# Patient Record
Sex: Female | Born: 1985 | Race: White | Hispanic: No | Marital: Single | Smoking: Never smoker
Health system: Southern US, Community
[De-identification: ages and names within clinical notes are randomized; demographics above are authoritative.]

## PROBLEM LIST (undated history)

## (undated) DIAGNOSIS — R87629 Unspecified abnormal cytological findings in specimens from vagina: Secondary | ICD-10-CM

## (undated) DIAGNOSIS — Z87448 Personal history of other diseases of urinary system: Secondary | ICD-10-CM

## (undated) HISTORY — PX: AUGMENTATION MAMMAPLASTY: SUR837

## (undated) HISTORY — DX: Personal history of other diseases of urinary system: Z87.448

## (undated) HISTORY — PX: TONSILLECTOMY: SUR1361

## (undated) HISTORY — DX: Unspecified abnormal cytological findings in specimens from vagina: R87.629

---
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
## 2003-01-02 ENCOUNTER — Other Ambulatory Visit: Admission: RE | Admit: 2003-01-02 | Discharge: 2003-01-02 | Payer: Self-pay | Admitting: Family Medicine

## 2004-10-26 ENCOUNTER — Ambulatory Visit: Payer: Self-pay | Admitting: Family Medicine

## 2004-10-26 ENCOUNTER — Other Ambulatory Visit: Admission: RE | Admit: 2004-10-26 | Discharge: 2004-10-26 | Payer: Self-pay | Admitting: Family Medicine

## 2004-11-21 ENCOUNTER — Ambulatory Visit (HOSPITAL_BASED_OUTPATIENT_CLINIC_OR_DEPARTMENT_OTHER): Admission: RE | Admit: 2004-11-21 | Discharge: 2004-11-21 | Payer: Self-pay | Admitting: Otolaryngology

## 2004-11-21 ENCOUNTER — Ambulatory Visit (HOSPITAL_COMMUNITY): Admission: RE | Admit: 2004-11-21 | Discharge: 2004-11-21 | Payer: Self-pay | Admitting: Otolaryngology

## 2004-11-21 ENCOUNTER — Encounter (INDEPENDENT_AMBULATORY_CARE_PROVIDER_SITE_OTHER): Payer: Self-pay | Admitting: *Deleted

## 2005-04-15 ENCOUNTER — Ambulatory Visit: Payer: Self-pay | Admitting: Family Medicine

## 2006-03-07 ENCOUNTER — Ambulatory Visit: Payer: Self-pay | Admitting: Family Medicine

## 2006-03-07 ENCOUNTER — Encounter: Payer: Self-pay | Admitting: Family Medicine

## 2006-03-07 ENCOUNTER — Other Ambulatory Visit: Admission: RE | Admit: 2006-03-07 | Discharge: 2006-03-07 | Payer: Self-pay | Admitting: Family Medicine

## 2006-03-07 LAB — CONVERTED CEMR LAB: Pap Smear: NORMAL

## 2006-05-23 ENCOUNTER — Ambulatory Visit: Payer: Self-pay | Admitting: Family Medicine

## 2006-10-12 ENCOUNTER — Ambulatory Visit: Payer: Self-pay | Admitting: Family Medicine

## 2007-05-14 ENCOUNTER — Telehealth (INDEPENDENT_AMBULATORY_CARE_PROVIDER_SITE_OTHER): Payer: Self-pay | Admitting: *Deleted

## 2007-06-28 ENCOUNTER — Encounter: Payer: Self-pay | Admitting: Family Medicine

## 2007-06-28 DIAGNOSIS — J309 Allergic rhinitis, unspecified: Secondary | ICD-10-CM | POA: Insufficient documentation

## 2007-06-28 DIAGNOSIS — F329 Major depressive disorder, single episode, unspecified: Secondary | ICD-10-CM | POA: Insufficient documentation

## 2007-07-18 ENCOUNTER — Ambulatory Visit: Payer: Self-pay | Admitting: Family Medicine

## 2007-07-18 ENCOUNTER — Other Ambulatory Visit: Admission: RE | Admit: 2007-07-18 | Discharge: 2007-07-18 | Payer: Self-pay | Admitting: Family Medicine

## 2007-07-18 ENCOUNTER — Encounter: Payer: Self-pay | Admitting: Family Medicine

## 2007-07-18 DIAGNOSIS — F988 Other specified behavioral and emotional disorders with onset usually occurring in childhood and adolescence: Secondary | ICD-10-CM | POA: Insufficient documentation

## 2007-07-24 ENCOUNTER — Encounter (INDEPENDENT_AMBULATORY_CARE_PROVIDER_SITE_OTHER): Payer: Self-pay | Admitting: *Deleted

## 2007-07-24 LAB — CONVERTED CEMR LAB: Pap Smear: ABNORMAL

## 2007-07-25 DIAGNOSIS — R87612 Low grade squamous intraepithelial lesion on cytologic smear of cervix (LGSIL): Secondary | ICD-10-CM | POA: Insufficient documentation

## 2007-08-26 ENCOUNTER — Encounter: Payer: Self-pay | Admitting: Family Medicine

## 2007-08-28 ENCOUNTER — Telehealth: Payer: Self-pay | Admitting: Family Medicine

## 2007-10-07 ENCOUNTER — Telehealth: Payer: Self-pay | Admitting: Family Medicine

## 2007-11-11 ENCOUNTER — Telehealth (INDEPENDENT_AMBULATORY_CARE_PROVIDER_SITE_OTHER): Payer: Self-pay | Admitting: *Deleted

## 2008-01-06 ENCOUNTER — Telehealth: Payer: Self-pay | Admitting: Family Medicine

## 2008-02-04 ENCOUNTER — Telehealth: Payer: Self-pay | Admitting: Family Medicine

## 2008-03-23 ENCOUNTER — Ambulatory Visit (HOSPITAL_COMMUNITY): Admission: RE | Admit: 2008-03-23 | Discharge: 2008-03-23 | Payer: Self-pay | Admitting: Family Medicine

## 2008-03-23 ENCOUNTER — Encounter: Payer: Self-pay | Admitting: Family Medicine

## 2008-03-23 ENCOUNTER — Ambulatory Visit: Payer: Self-pay | Admitting: Vascular Surgery

## 2008-03-23 ENCOUNTER — Ambulatory Visit: Payer: Self-pay | Admitting: Family Medicine

## 2008-04-06 ENCOUNTER — Telehealth: Payer: Self-pay | Admitting: Family Medicine

## 2008-05-13 ENCOUNTER — Telehealth: Payer: Self-pay | Admitting: Family Medicine

## 2008-06-19 ENCOUNTER — Telehealth: Payer: Self-pay | Admitting: Family Medicine

## 2008-06-30 ENCOUNTER — Encounter (INDEPENDENT_AMBULATORY_CARE_PROVIDER_SITE_OTHER): Payer: Self-pay | Admitting: *Deleted

## 2008-07-29 ENCOUNTER — Telehealth (INDEPENDENT_AMBULATORY_CARE_PROVIDER_SITE_OTHER): Payer: Self-pay | Admitting: *Deleted

## 2008-08-07 ENCOUNTER — Ambulatory Visit: Payer: Self-pay | Admitting: Family Medicine

## 2008-08-07 DIAGNOSIS — F411 Generalized anxiety disorder: Secondary | ICD-10-CM | POA: Insufficient documentation

## 2008-08-24 ENCOUNTER — Ambulatory Visit: Payer: Self-pay | Admitting: Professional

## 2008-10-01 ENCOUNTER — Telehealth: Payer: Self-pay | Admitting: Family Medicine

## 2008-10-05 ENCOUNTER — Ambulatory Visit: Payer: Self-pay | Admitting: Family Medicine

## 2008-10-05 DIAGNOSIS — L299 Pruritus, unspecified: Secondary | ICD-10-CM | POA: Insufficient documentation

## 2008-12-16 ENCOUNTER — Telehealth: Payer: Self-pay | Admitting: Family Medicine

## 2009-01-28 ENCOUNTER — Telehealth (INDEPENDENT_AMBULATORY_CARE_PROVIDER_SITE_OTHER): Payer: Self-pay | Admitting: Internal Medicine

## 2009-02-12 ENCOUNTER — Telehealth: Payer: Self-pay | Admitting: Family Medicine

## 2009-04-07 ENCOUNTER — Telehealth: Payer: Self-pay | Admitting: Family Medicine

## 2009-05-11 ENCOUNTER — Telehealth: Payer: Self-pay | Admitting: Family Medicine

## 2009-07-28 ENCOUNTER — Telehealth: Payer: Self-pay | Admitting: Family Medicine

## 2011-01-03 NOTE — Progress Notes (Signed)
Summary: needs adderal refilled  Phone Note Call from Patient   Caller: Patient Call For: dr tower Summary of Call: pt needs refills on adderal scripts Initial call taken by: Lowella Petties,  July 29, 2008 9:16 AM  Follow-up for Phone Call        printed and in my out box for pick up  Follow-up by: Judith Part MD,  July 29, 2008 10:23 AM  Additional Follow-up for Phone Call Additional follow up Details #1::        Left message on answering machine. ...............................................Marland KitchenLiane Comber July 29, 2008 10:43 AM Rx left at front desk for patient to pick up. .............................................................Marland KitchenMarcelle Smiling Caidin Heidenreich July 29, 2008 10:43 AM     New/Updated Medications: ADDERALL XR 20 MG CP24 (AMPHETAMINE-DEXTROAMPHETAMINE) 1 by mouth every am ADDERALL 10 MG  TABS (AMPHETAMINE-DEXTROAMPHETAMINE) 1 by mouth at 4 pm each day   Prescriptions: ADDERALL 10 MG  TABS (AMPHETAMINE-DEXTROAMPHETAMINE) 1 by mouth at 4 pm each day  #30 x 0   Entered and Authorized by:   Judith Part MD   Signed by:   Judith Part MD on 07/29/2008   Method used:   Print then Give to Patient   RxID:   [id number] ADDERALL XR 20 MG CP24 (AMPHETAMINE-DEXTROAMPHETAMINE) 1 by mouth every am  #30 x 0   Entered and Authorized by:   Judith Part MD   Signed by:   Judith Part MD on 07/29/2008   Method used:   Print then Give to Patient   RxID:   [id number]

## 2011-04-21 NOTE — Op Note (Signed)
NAMELYSSA, HACKLEY                  ACCOUNT NO.:  [id number]   MEDICAL RECORD NO.:  [id number]          PATIENT TYPE:  AMB   LOCATION:  DSC                          FACILITY:  [facility]   PHYSICIAN:  Kinnie Scales. Annalee Genta, M.D.DATE OF BIRTH:  1986/05/06   DATE OF PROCEDURE:  11/21/2004  DATE OF DISCHARGE:                                 OPERATIVE REPORT   PREOPERATIVE DIAGNOSES:  1.  Chronic cryptic tonsillitis.  2.  Adenotonsillar hypertrophy.   POSTOPERATIVE DIAGNOSES:  1.  Chronic cryptic tonsillitis.  2.  Adenotonsillar hypertrophy.   OPERATION PERFORMED:  Tonsillectomy and adenoidectomy.   SURGEON:  Kinnie Scales. Annalee Genta, M.D.   ANESTHESIA:  General endotracheal.   COMPLICATIONS:  None.   ESTIMATED BLOOD LOSS:  Minimal.   Patient transferred from the operating room to the recovery room in stable  condition.   INDICATIONS FOR PROCEDURE:  Ms. Avis Epley is a 25 year old white female who was  referred for evaluation of recurrent tonsillitis, chronic cryptic tonsillar  changes with halitosis, sore throat and low grade fever and adenotonsillar  hypertrophy.  Given the patient's history, examination and findings, I  recommended we undertake tonsillectomy and adenoidectomy under general  anesthesia.  The risks, benefits, alternatives and possible complications of  these procedures were discussed in detail with the patient and her family,  who understood and concurred with our plan for surgery which was scheduled  as above.   DESCRIPTION OF PROCEDURE:  The patient was brought to the operating room on  November 21, 2004 and placed in supine position on the operating table.  General endotracheal was established without difficulty.  With the patient  adequately anesthetized, the Crowe-Davis mouth gag was inserted.  There were  no loose or broken teeth.  The hard and soft palate were intact.  Posterior  nasopharynx was examined.  The patient had adenoidal hypertrophy, adenoids  were removed  using Bovie suction cautery and __________ Malka So  forceps.  The nasopharynx was widely patent at the conclusion of the  surgical procedure and there was no bleeding.   Adenotonsillectomy was then performed again on the patient's left hand side  using a Harmonic scalpel and dissecting in subcapsular fashion.  The entire  left tonsil was resected from superior pole to tongue base, right tonsil was  removed in similar fashion.  Several areas of point hemorrhage were  cauterized with suction cautery.  A dry tonsil sponge was then used to  gently abrade the tonsillar fossa.  The Crowe-Davis mouth gag was released  and reapplied.  There was no active bleeding.  The orogastric tube was  passed and stomach contents were aspirated.  The  Crowe-Davis mouth gag was released and removed.  There were no loose or  broken teeth and no bleeding.  The patient was awakened from her anesthetic,  extubated and transferred from the operating room to the recovery room in  stable condition.  No complications, blood loss minimal.       DLS/MEDQ  D:  86/57/8469  T:  11/21/2004  Job:  [id number]

## 2019-07-29 LAB — OB RESULTS CONSOLE HEPATITIS B SURFACE ANTIGEN: Hepatitis B Surface Ag: NEGATIVE

## 2019-07-29 LAB — OB RESULTS CONSOLE ABO/RH: RH Type: POSITIVE

## 2019-07-29 LAB — OB RESULTS CONSOLE ANTIBODY SCREEN: Antibody Screen: NEGATIVE

## 2019-07-29 LAB — OB RESULTS CONSOLE GC/CHLAMYDIA
Chlamydia: NEGATIVE
Gonorrhea: NEGATIVE

## 2019-07-29 LAB — OB RESULTS CONSOLE HIV ANTIBODY (ROUTINE TESTING): HIV: NONREACTIVE

## 2019-07-29 LAB — OB RESULTS CONSOLE RUBELLA ANTIBODY, IGM: Rubella: IMMUNE

## 2019-07-29 LAB — OB RESULTS CONSOLE RPR: RPR: NONREACTIVE

## 2019-11-04 ENCOUNTER — Other Ambulatory Visit: Payer: Self-pay

## 2019-11-04 DIAGNOSIS — Z20822 Contact with and (suspected) exposure to covid-19: Secondary | ICD-10-CM

## 2019-11-06 LAB — NOVEL CORONAVIRUS, NAA: SARS-CoV-2, NAA: DETECTED — AB

## 2019-11-07 ENCOUNTER — Encounter: Payer: Self-pay | Admitting: *Deleted

## 2019-11-10 ENCOUNTER — Other Ambulatory Visit: Payer: Self-pay

## 2019-11-10 DIAGNOSIS — Z20822 Contact with and (suspected) exposure to covid-19: Secondary | ICD-10-CM

## 2019-11-11 LAB — NOVEL CORONAVIRUS, NAA: SARS-CoV-2, NAA: NOT DETECTED

## 2019-12-05 NOTE — L&D Delivery Note (Signed)
Delivery Note At 6:26 AM a viable and healthy female was delivered via Vaginal, Spontaneous (Presentation: Middle Occiput Anterior).  APGAR: , 9; weight  .   Placenta status: Spontaneous;Expressed, Intact.  Cord: 3 vessels with the following complications: None.  Cord pH: na  Anesthesia: Epidural Episiotomy: None Lacerations: 2nd degree Suture Repair: 2.0 vicryl rapide Est. Blood Loss (mL): 300  Mom to postpartum.  Baby to Couplet care / Skin to Skin.  Cambell Rickenbach J 02/23/2020, 7:06 AM

## 2020-01-24 ENCOUNTER — Encounter (HOSPITAL_COMMUNITY): Payer: Self-pay

## 2020-01-24 ENCOUNTER — Inpatient Hospital Stay (HOSPITAL_COMMUNITY)
Admission: AD | Admit: 2020-01-24 | Discharge: 2020-01-24 | Disposition: A | Discharge status: Home / Self Care | Payer: Managed Care, Other (non HMO) | Attending: Obstetrics and Gynecology | Admitting: Obstetrics and Gynecology

## 2020-01-24 DIAGNOSIS — L299 Pruritus, unspecified: Secondary | ICD-10-CM | POA: Diagnosis not present

## 2020-01-24 DIAGNOSIS — K760 Fatty (change of) liver, not elsewhere classified: Secondary | ICD-10-CM | POA: Diagnosis not present

## 2020-01-24 DIAGNOSIS — O26893 Other specified pregnancy related conditions, third trimester: Secondary | ICD-10-CM | POA: Insufficient documentation

## 2020-01-24 DIAGNOSIS — O368130 Decreased fetal movements, third trimester, not applicable or unspecified: Secondary | ICD-10-CM | POA: Insufficient documentation

## 2020-01-24 DIAGNOSIS — Z3A34 34 weeks gestation of pregnancy: Secondary | ICD-10-CM | POA: Insufficient documentation

## 2020-01-24 DIAGNOSIS — O26613 Liver and biliary tract disorders in pregnancy, third trimester: Secondary | ICD-10-CM | POA: Diagnosis not present

## 2020-01-24 DIAGNOSIS — O99713 Diseases of the skin and subcutaneous tissue complicating pregnancy, third trimester: Secondary | ICD-10-CM | POA: Diagnosis not present

## 2020-01-24 LAB — COMPREHENSIVE METABOLIC PANEL
ALT: 15 U/L (ref 0–44)
AST: 17 U/L (ref 15–41)
Albumin: 2.5 g/dL — ABNORMAL LOW (ref 3.5–5.0)
Alkaline Phosphatase: 85 U/L (ref 38–126)
Anion gap: 8 (ref 5–15)
BUN: 7 mg/dL (ref 6–20)
CO2: 19 mmol/L — ABNORMAL LOW (ref 22–32)
Calcium: 8.5 mg/dL — ABNORMAL LOW (ref 8.9–10.3)
Chloride: 107 mmol/L (ref 98–111)
Creatinine, Ser: 0.61 mg/dL (ref 0.44–1.00)
GFR calc Af Amer: >60 mL/min (ref >60)
GFR calc non Af Amer: >60 mL/min (ref >60)
Glucose, Bld: 116 mg/dL — ABNORMAL HIGH (ref 70–99)
Potassium: 3.4 mmol/L — ABNORMAL LOW (ref 3.5–5.1)
Sodium: 134 mmol/L — ABNORMAL LOW (ref 135–145)
Total Bilirubin: 0.4 mg/dL (ref 0.3–1.2)
Total Protein: 5.4 g/dL — ABNORMAL LOW (ref 6.5–8.1)

## 2020-01-24 MED ORDER — CALAMINE EX LOTN
TOPICAL_LOTION | CUTANEOUS | Status: DC | PRN
  Filled 2020-01-24: qty 177

## 2020-01-24 MED ORDER — DIPHENHYDRAMINE HCL 25 MG PO CAPS
25.0000 mg | ORAL_CAPSULE | Freq: Once | ORAL | Status: AC
  Administered 2020-01-24: 21:00:00 25 mg via ORAL
  Filled 2020-01-24: qty 1

## 2020-01-24 MED ORDER — LORATADINE 10 MG PO TABS
10.0000 mg | ORAL_TABLET | Freq: Once | ORAL | Status: AC
  Administered 2020-01-24: 22:00:00 10 mg via ORAL
  Filled 2020-01-24: qty 1

## 2020-01-24 NOTE — MAU Note (Addendum)
Patient presents to MAU c/o DFM and body itching. Patient stated her OBGYN told her to get her labs drawn for cholestasis. Patient reports having "mild fatty liver disease" Patient states last fetal movement felt a few hours ago.  Denies vaginal bleeding or LOF.

## 2020-01-24 NOTE — MAU Provider Note (Signed)
History     [id number]  Arrival date and time: 01/24/20 2017    Chief Complaint  Patient presents with  . Decreased Fetal Movement  . Pruritis     HPI Tracy Alexander is a 34 y.o. at [redacted]w[redacted]d by 9 wk [place] with PMHx notable for AMA, who presents for itching and DFM.   #Itching Started to notice itching two days ago Told doctor about it yesterday, sent rx for hydroxizine Is very intense, can't sleep Itching is generalized, less so in the stomach More in toes, heels, shins, wrists No rash that she has seen No prior hx of itching, no hx of eczema hydroxizine did not help, has not taken anything else Reports hx of fatty liver disease Had [place] last year that showed mild steatosis, and mildly elevated LFT's, last check 06/03/2019 and AST 37, ALT 57  #DFM Just felt baby move upon clinician entering room Prior to that had not felt for four hours  Vaginal bleeding: No LOF: No Fetal Movement: No Contractions: No     OB History    Gravida  2   Para  1   Term  1   Preterm      AB      Living  1     SAB      TAB      Ectopic      Multiple      Live Births  1           History reviewed. No pertinent past medical history.  Past Surgical History:  Procedure Laterality Date  . AUGMENTATION MAMMAPLASTY      History reviewed. No pertinent family history.  Social History   Socioeconomic History  . Marital status: Single    Spouse name: Not on file  . Number of children: Not on file  . Years of education: Not on file  . Highest education level: Not on file  Occupational History  . Not on file  Tobacco Use  . Smoking status: Never Smoker  . Smokeless tobacco: Never Used  Substance and Sexual Activity  . Alcohol use: Not on file  . Drug use: Not on file  . Sexual activity: Not on file  Other Topics Concern  . Not on file  Social History Narrative  . Not on file   Social Determinants of Health   Financial Resource Strain:   . Difficulty of Paying  Living Expenses: Not on file  Food Insecurity:   . Worried About Programme researcher, broadcasting/film/video in the Last Year: Not on file  . Ran Out of Food in the Last Year: Not on file  Transportation Needs:   . Lack of Transportation (Medical): Not on file  . Lack of Transportation (Non-Medical): Not on file  Physical Activity:   . Days of Exercise per Week: Not on file  . Minutes of Exercise per Session: Not on file  Stress:   . Feeling of Stress : Not on file  Social Connections:   . Frequency of Communication with Friends and Family: Not on file  . Frequency of Social Gatherings with Friends and Family: Not on file  . Attends Religious Services: Not on file  . Active Member of Clubs or Organizations: Not on file  . Attends Banker Meetings: Not on file  . Marital Status: Not on file  Intimate Partner Violence:   . Fear of Current or Ex-Partner: Not on file  . Emotionally Abused: Not on file  .  Physically Abused: Not on file  . Sexually Abused: Not on file    Allergies  Allergen Reactions  . Cefaclor     REACTION: rash, vomiting  . Penicillins     REACTION: rash    No current facility-administered medications on file prior to encounter.   Current Outpatient Medications on File Prior to Encounter  Medication Sig Dispense Refill  . cetirizine (ZYRTEC) 10 MG tablet Take 10 mg by mouth daily.    . hydrOXYzine (VISTARIL) 50 MG capsule Take 50 mg by mouth 3 (three) times daily as needed. Patient unsure of dosing    . Prenatal Vit-Fe Fumarate-FA (PRENATAL MULTIVITAMIN) TABS tablet Take 1 tablet by mouth daily at 12 noon.       ROS Complete ROS completed and otherwise negative except as noted in HPI  Physical Exam   BP 107/65 (BP Location: Right Arm)   Pulse 88   Temp 97.9 F (36.6 C) (Oral)   Resp 20   SpO2 98%   Physical Exam  Vitals reviewed. Constitutional: She appears well-developed and well-nourished. No distress.  Eyes: No scleral icterus.  Respiratory: Effort  normal. No respiratory distress.  GI: Soft. She exhibits no distension. There is no abdominal tenderness. There is no rebound and no guarding.  Musculoskeletal:        General: No edema.  Neurological: She is alert. Coordination normal.  Skin: Skin is warm and dry. She is not diaphoretic.  No rashes appreciated  Psychiatric: She has a normal mood and affect.    Bedside Ultrasound Not performed  FHT Baseline 125, moderate variability, +accels, -decels Toco : none  Cat I, reactive NST   Labs Results for orders placed or performed during the hospital encounter of 01/24/20 (from the past 24 hour(s))  Comprehensive metabolic panel     Status: Abnormal   Collection Time: 01/24/20  9:29 PM  Result Value Ref Range   Sodium 134 (L) 135 - 145 mmol/L   Potassium 3.4 (L) 3.5 - 5.1 mmol/L   Chloride 107 98 - 111 mmol/L   CO2 19 (L) 22 - 32 mmol/L   Glucose, Bld 116 (H) 70 - 99 mg/dL   BUN 7 6 - 20 mg/dL   Creatinine, Ser 0.61 0.44 - 1.00 mg/dL   Calcium 8.5 (L) 8.9 - 10.3 mg/dL   Total Protein 5.4 (L) 6.5 - 8.1 g/dL   Albumin 2.5 (L) 3.5 - 5.0 g/dL   AST 17 15 - 41 U/L   ALT 15 0 - 44 U/L   Alkaline Phosphatase 85 38 - 126 U/L   Total Bilirubin 0.4 0.3 - 1.2 mg/dL   GFR calc non Af Amer >60 >60 mL/min   GFR calc Af Amer >60 >60 mL/min   Anion gap 8 5 - 15    Imaging Not done  MAU Course  Procedures NST Meds ordered this encounter  Medications  . calamine lotion  . diphenhydrAMINE (BENADRYL) capsule 25 mg  . loratadine (CLARITIN) tablet 10 mg    MDM Moderate  Assessment and Plan  #Pruritus in pregnancy Suspect cholestasis given generalized pruritus, lack of rash. CMP sent with normal LFTs, bile acids pending. Low suspicion for PUPPPs or other dermatoses given lack of rash. Reports no relief at home w hydroxizine, minimal improvement here w calamine, benadryl, claritin. Discussed ongoing symptomatic treatment, pending bile acids and confirmation of diagnosis likely IOL  at 37wks or sooner pending labs. Encouraged to continue kick counts, follow up in office.  #DFM Resolved  during stay Reactive NST  #FWB FHT Cat I NST Reactive  Tracy Alexander

## 2020-01-24 NOTE — Discharge Instructions (Signed)
Cholestasis of Pregnancy  Cholestasis refers to any condition that causes the flow of digestive fluid (bile) produced by the liver to slow down or stop. Cholestasis of pregnancy is most common toward the end of pregnancy (thirdtrimester), but it can occur any time during pregnancy. The condition often goes away soon after giving birth. Cholestasis may be uncomfortable, but it is usually harmless to you. However, it can be harmful to your baby. Cholestasis may increase the risk of:  Your baby being born too early (preterm delivery).  Your baby having a slow heart rate and lack of oxygen during delivery (fetal distress).  Losing your baby before delivery (stillbirth). What are the causes? The exact cause of this condition is not known, but it may be related to:  Pregnancy hormones. The gallbladder normally holds bile until you need it to help digest fat in your diet. Pregnancy hormones may cause the flow of bile to slow down and back up into your liver. Bile may then get into your bloodstream and cause cholestasis symptoms.  Changes in your genes (genetic mutations). Specifically, genes that affect how the liver releases bile. What increases the risk? You are more likely to develop this condition if:  You had cholestasis during a previous pregnancy.  You have a family history of cholestasis.  You have liver problems.  You are having multiple babies, such as twins or triplets. What are the signs or symptoms? The most common symptom of this condition is intense itching (pruritus), especially on the palms of your hands and soles of your feet. The itching can spread to the rest of your body and is often worse at night. You will not usually have a rash. Other symptoms may include:  Feeling tired.  Pain in your upper right abdomen.  Dark-colored urine.  Light-colored stools.  Poor appetite.  Yellowish discoloration of your skin and the whites of your eyes (jaundice). How is this  diagnosed? This condition is diagnosed based on:  Your medical history.  A physical exam.  Blood tests. If you have an inherited risk for developing this condition, you may also have genetic testing. How is this treated? The goal of treatment is to make you comfortable and keep your baby safe. Your health care provider may:  Prescribe medicine to reduce bile acid in your bloodstream, relieve symptoms, and help keep your baby safe.  Give you vitamin K before delivery to prevent excessive bleeding.  Check your baby frequently (fetal monitoring).  Perform regular blood tests to check your bile levels and liver function until your baby is delivered.  Recommend starting (inducing) your labor and delivery by week 36 or 37 of pregnancy, or as soon as your baby's lungs have developed enough. Follow these instructions at home:  Take over-the-counter and prescription medicines only as told by your health care provider.  Take cool baths to soothe itchy skin.  Wear comfortable, loose-fitting, cotton clothing to reduce itching.  Keep your fingernails short to prevent skin irritation from scratching.  Keep all follow-up visits and prenatal visits as told by your health care provider. This is important. Contact a health care provider if:  Your symptoms get worse, even with treatment.  You develop pain in your right side.  You have unusual swelling in your abdomen, feet, ankles, or legs.  You have a fever.  You are more thirsty than usual. Get help right away if:  You go into early labor.  You have a headache that does not go away or   causes changes in vision.  You have nausea or you vomit.  You have severe pain in your abdomen or shoulders.  You have shortness of breath. Summary  Cholestasis of pregnancy is most common toward the end of pregnancy (thirdtrimester), but it can occur any time during your pregnancy.  The condition often goes away soon after your baby is  born.  The most common symptom of cholestasis of pregnancy is intense itching (pruritus), especially on the palms of your hands and soles of your feet.  This condition may be treated with medicine, frequent monitoring, or starting (inducing) labor and delivery by week 36 or 37 of pregnancy. This information is not intended to replace advice given to you by your health care provider. Make sure you discuss any questions you have with your health care provider. Document Revised: 03/13/2019 Document Reviewed: 11/04/2016 Elsevier Patient Education  2020 Else[employer] Inc.  

## 2020-01-26 LAB — BILE ACIDS, TOTAL: Bile Acids Total: 4.4 umol/L (ref 0.0–10.0)

## 2020-02-05 LAB — OB RESULTS CONSOLE GBS: GBS: NEGATIVE

## 2020-02-19 ENCOUNTER — Encounter (HOSPITAL_COMMUNITY): Payer: Self-pay | Admitting: *Deleted

## 2020-02-19 ENCOUNTER — Telehealth (HOSPITAL_COMMUNITY): Payer: Self-pay | Admitting: *Deleted

## 2020-02-19 NOTE — Telephone Encounter (Signed)
Preadmission screen  

## 2020-02-20 ENCOUNTER — Other Ambulatory Visit: Payer: Self-pay | Admitting: Obstetrics and Gynecology

## 2020-02-21 ENCOUNTER — Other Ambulatory Visit (HOSPITAL_COMMUNITY)
Admission: RE | Admit: 2020-02-21 | Discharge: 2020-02-21 | Disposition: A | Discharge status: Home / Self Care | Payer: Managed Care, Other (non HMO) | Source: Ambulatory Visit | Attending: Obstetrics and Gynecology | Admitting: Obstetrics and Gynecology

## 2020-02-21 LAB — SARS CORONAVIRUS 2 (TAT 6-24 HRS): SARS Coronavirus 2: NEGATIVE

## 2020-02-22 ENCOUNTER — Encounter (HOSPITAL_COMMUNITY): Payer: Self-pay | Admitting: Obstetrics and Gynecology

## 2020-02-22 ENCOUNTER — Inpatient Hospital Stay (HOSPITAL_COMMUNITY): Payer: Managed Care, Other (non HMO) | Admitting: Anesthesiology

## 2020-02-22 ENCOUNTER — Inpatient Hospital Stay (HOSPITAL_COMMUNITY): Payer: Managed Care, Other (non HMO)

## 2020-02-22 ENCOUNTER — Inpatient Hospital Stay (HOSPITAL_COMMUNITY)
Admission: AD | Admit: 2020-02-22 | Discharge: 2020-02-24 | DRG: 805 | Disposition: A | Discharge status: Home / Self Care | Payer: Managed Care, Other (non HMO) | Attending: Obstetrics and Gynecology | Admitting: Obstetrics and Gynecology

## 2020-02-22 ENCOUNTER — Other Ambulatory Visit: Payer: Self-pay

## 2020-02-22 DIAGNOSIS — Z3A38 38 weeks gestation of pregnancy: Secondary | ICD-10-CM

## 2020-02-22 DIAGNOSIS — D62 Acute posthemorrhagic anemia: Secondary | ICD-10-CM | POA: Diagnosis not present

## 2020-02-22 DIAGNOSIS — O403XX0 Polyhydramnios, third trimester, not applicable or unspecified: Secondary | ICD-10-CM | POA: Diagnosis present

## 2020-02-22 DIAGNOSIS — Z3490 Encounter for supervision of normal pregnancy, unspecified, unspecified trimester: Secondary | ICD-10-CM | POA: Diagnosis present

## 2020-02-22 DIAGNOSIS — D6959 Other secondary thrombocytopenia: Secondary | ICD-10-CM | POA: Diagnosis present

## 2020-02-22 DIAGNOSIS — K831 Obstruction of bile duct: Secondary | ICD-10-CM | POA: Diagnosis present

## 2020-02-22 DIAGNOSIS — O2662 Liver and biliary tract disorders in childbirth: Secondary | ICD-10-CM | POA: Diagnosis present

## 2020-02-22 DIAGNOSIS — O9912 Other diseases of the blood and blood-forming organs and certain disorders involving the immune mechanism complicating childbirth: Secondary | ICD-10-CM | POA: Diagnosis present

## 2020-02-22 DIAGNOSIS — O9081 Anemia of the puerperium: Secondary | ICD-10-CM | POA: Diagnosis not present

## 2020-02-22 DIAGNOSIS — Z20822 Contact with and (suspected) exposure to covid-19: Secondary | ICD-10-CM | POA: Diagnosis present

## 2020-02-22 LAB — RPR: RPR Ser Ql: NONREACTIVE

## 2020-02-22 LAB — CBC
HCT: 32.1 % — ABNORMAL LOW (ref 36.0–46.0)
Hemoglobin: 10.4 g/dL — ABNORMAL LOW (ref 12.0–15.0)
MCH: 28 pg (ref 26.0–34.0)
MCHC: 32.4 g/dL (ref 30.0–36.0)
MCV: 86.5 fL (ref 80.0–100.0)
Platelets: 146 10*3/uL — ABNORMAL LOW (ref 150–400)
RBC: 3.71 MIL/uL — ABNORMAL LOW (ref 3.87–5.11)
RDW: 12.9 % (ref 11.5–15.5)
WBC: 8.4 10*3/uL (ref 4.0–10.5)
nRBC: 0 % (ref 0.0–0.2)

## 2020-02-22 LAB — TYPE AND SCREEN
ABO/RH(D): O POS
Antibody Screen: NEGATIVE

## 2020-02-22 LAB — ABO/RH: ABO/RH(D): O POS

## 2020-02-22 MED ORDER — ONDANSETRON HCL 4 MG/2ML IJ SOLN
4.0000 mg | Freq: Four times a day (QID) | INTRAMUSCULAR | Status: DC | PRN
  Administered 2020-02-22: 4 mg via INTRAVENOUS
  Filled 2020-02-22: qty 2

## 2020-02-22 MED ORDER — SOD CITRATE-CITRIC ACID 500-334 MG/5ML PO SOLN: 30.0000 mL | ORAL | Status: DC | PRN

## 2020-02-22 MED ORDER — PHENYLEPHRINE 40 MCG/ML (10ML) SYRINGE FOR IV PUSH (FOR BLOOD PRESSURE SUPPORT)
80.0000 ug | PREFILLED_SYRINGE | INTRAVENOUS | Status: DC | PRN
  Filled 2020-02-22: qty 10

## 2020-02-22 MED ORDER — OXYTOCIN 40 UNITS IN NORMAL SALINE INFUSION - SIMPLE MED
1.0000 m[IU]/min | INTRAVENOUS | Status: DC
  Administered 2020-02-22: 2 m[IU]/min via INTRAVENOUS

## 2020-02-22 MED ORDER — TERBUTALINE SULFATE 1 MG/ML IJ SOLN: 0.2500 mg | Freq: Once | INTRAMUSCULAR | Status: DC | PRN

## 2020-02-22 MED ORDER — OXYTOCIN 40 UNITS IN NORMAL SALINE INFUSION - SIMPLE MED: 2.5000 [IU]/h | INTRAVENOUS | Status: DC

## 2020-02-22 MED ORDER — EPHEDRINE 5 MG/ML INJ: 10.0000 mg | INTRAVENOUS | Status: DC | PRN

## 2020-02-22 MED ORDER — LIDOCAINE HCL (PF) 1 % IJ SOLN
INTRAMUSCULAR | Status: DC | PRN
  Administered 2020-02-22: 6 mL via EPIDURAL

## 2020-02-22 MED ORDER — LACTATED RINGERS IV SOLN
500.0000 mL | Freq: Once | INTRAVENOUS | Status: AC
  Administered 2020-02-22: 500 mL via INTRAVENOUS

## 2020-02-22 MED ORDER — OXYTOCIN BOLUS FROM INFUSION: 500.0000 mL | Freq: Once | INTRAVENOUS | Status: AC

## 2020-02-22 MED ORDER — FENTANYL-BUPIVACAINE-NACL 0.5-0.125-0.9 MG/250ML-% EP SOLN
12.0000 mL/h | EPIDURAL | Status: DC | PRN
  Filled 2020-02-22: qty 250

## 2020-02-22 MED ORDER — PHENYLEPHRINE 40 MCG/ML (10ML) SYRINGE FOR IV PUSH (FOR BLOOD PRESSURE SUPPORT): 80.0000 ug | PREFILLED_SYRINGE | INTRAVENOUS | Status: DC | PRN

## 2020-02-22 MED ORDER — MISOPROSTOL 25 MCG QUARTER TABLET
25.0000 ug | ORAL_TABLET | ORAL | Status: DC | PRN
  Filled 2020-02-22: qty 1

## 2020-02-22 MED ORDER — DIPHENHYDRAMINE HCL 50 MG/ML IJ SOLN: 12.5000 mg | INTRAMUSCULAR | Status: DC | PRN

## 2020-02-22 MED ORDER — LACTATED RINGERS IV SOLN
INTRAVENOUS | Status: DC
  Administered 2020-02-22: 1000 mL via INTRAVENOUS

## 2020-02-22 MED ORDER — LIDOCAINE HCL (PF) 1 % IJ SOLN: 30.0000 mL | INTRAMUSCULAR | Status: DC | PRN

## 2020-02-22 MED ORDER — SODIUM CHLORIDE (PF) 0.9 % IJ SOLN
INTRAMUSCULAR | Status: DC | PRN
  Administered 2020-02-22: 12 mL/h via EPIDURAL

## 2020-02-22 MED ORDER — OXYTOCIN 40 UNITS IN NORMAL SALINE INFUSION - SIMPLE MED
1.0000 m[IU]/min | INTRAVENOUS | Status: DC
  Administered 2020-02-22: 2 m[IU]/min via INTRAVENOUS
  Administered 2020-02-22: 6 m[IU]/min via INTRAVENOUS
  Administered 2020-02-22: 8 m[IU]/min via INTRAVENOUS
  Administered 2020-02-22: 10 m[IU]/min via INTRAVENOUS
  Administered 2020-02-22: 4 m[IU]/min via INTRAVENOUS
  Administered 2020-02-22: 12 m[IU]/min via INTRAVENOUS
  Administered 2020-02-23: 20 m[IU]/min via INTRAVENOUS
  Administered 2020-02-23: 14 m[IU]/min via INTRAVENOUS
  Administered 2020-02-23: 16 m[IU]/min via INTRAVENOUS
  Administered 2020-02-23: 18 m[IU]/min via INTRAVENOUS

## 2020-02-22 MED ORDER — ACETAMINOPHEN 325 MG PO TABS: 650.0000 mg | ORAL_TABLET | ORAL | Status: DC | PRN

## 2020-02-22 MED ORDER — OXYTOCIN 40 UNITS IN NORMAL SALINE INFUSION - SIMPLE MED
INTRAVENOUS | Status: AC
  Administered 2020-02-23: 500 mL/h via INTRAVENOUS
  Filled 2020-02-22: qty 1000

## 2020-02-22 MED ORDER — LACTATED RINGERS IV SOLN: 500.0000 mL | INTRAVENOUS | Status: DC | PRN

## 2020-02-22 NOTE — Anesthesia Procedure Notes (Signed)
Epidural Patient location during procedure: OB Start time: 02/22/2020 2:34 PM End time: 02/22/2020 2:40 PM  Staffing Anesthesiologist: Bethena Midget, MD  Preanesthetic Checklist Completed: patient identified, IV checked, site marked, risks and benefits discussed, surgical consent, monitors and equipment checked, pre-op evaluation and timeout performed  Epidural Patient position: sitting Prep: DuraPrep and site prepped and draped Patient monitoring: continuous pulse ox and blood pressure Approach: midline Location: L3-L4 Injection technique: LOR air  Needle:  Needle type: Tuohy  Needle gauge: 17 G Needle length: 9 cm and 9 Needle insertion depth: 6 cm Catheter type: closed end flexible Catheter size: 19 Gauge Catheter at skin depth: 11 cm Test dose: negative  Assessment Events: blood not aspirated, injection not painful, no injection resistance, no paresthesia and negative IV test

## 2020-02-22 NOTE — Plan of Care (Signed)
  Problem: Pain Managment: Goal: General experience of comfort will improve Outcome: Progressing  Patient has been breathing through contractions well. Dr. Billy Coast AROM'd patient at 1203. Patient would like an epidural when she can not tolerate the pain. Patient aware that she can request an epidural at any time. Will continue to evaluate patient's pain management.

## 2020-02-22 NOTE — Progress Notes (Signed)
Tracy Alexander is a 34 y.o. G2P1001 at [redacted]w[redacted]d by LMP admitted for induction of labor due to ICP.  Subjective: NO itching  Comfortable with epidural  Objective: BP (!) 93/58 (BP Location: Left Arm)   Pulse 77   Temp 97.8 F (36.6 C) (Oral)   Resp 16   Ht 5\' 3"  (1.6 m)   Wt 93.4 kg   SpO2 100%   BMI 36.46 kg/m  No intake/output data recorded. Total I/O In: -  Out: 450 [Urine:450]  FHT:  FHR: 155 bpm, variability: moderate,  accelerations:  Present,  decelerations:  Absent UC:   irregular, every 2-4 minutes SVE:   Dilation: 2.5 Effacement (%): 80 Station: -2 Exam by:: Dr. [email or web address]  IUPC placed without difficulty  Labs: Lab Results  Component Value Date   WBC 8.4 02/22/2020   HGB 10.4 (L) 02/22/2020   HCT 32.1 (L) 02/22/2020   MCV 86.5 02/22/2020   PLT 146 (L) 02/22/2020    Assessment / Plan: Induction of labor due to ICP,  progressing well on pitocin  Labor: Progressing normally Preeclampsia:  no signs or symptoms of toxicity Fetal Wellbeing:  Category I Pain Control:  Labor support without medications I/D:  n/a Anticipated MOD:  NSVD  Tracy Alexander J 02/22/2020, 6:43 PM

## 2020-02-22 NOTE — H&P (Signed)
Tracy Alexander is a 34 y.o. female presenting for IOL for cholestasis of pregnancy. OB History    Gravida  2   Para  1   Term  1   Preterm      AB      Living  1     SAB      TAB      Ectopic      Multiple      Live Births  1          Past Medical History:  Diagnosis Date  . History of pyelonephritis   . Vaginal Pap smear, abnormal    Past Surgical History:  Procedure Laterality Date  . AUGMENTATION MAMMAPLASTY    . TONSILLECTOMY     Family History: family history includes Leukemia in her father; Prostate cancer in her father; Skin cancer in her paternal grandmother. Social History:  reports that she has never smoked. She has never used smokeless tobacco. She reports previous alcohol use. No history on file for drug.     Maternal Diabetes: No Genetic Screening: Normal Maternal Ultrasounds/Referrals: Normal Fetal Ultrasounds or other Referrals:  None Maternal Substance Abuse:  No Significant Maternal Medications:  Meds include: Other:  hydroxyzine Significant Maternal Lab Results:  Group B Strep negative Other Comments:  None  Review of Systems  Constitutional: Negative.   All other systems reviewed and are negative.  Maternal Medical History:  Contractions: Onset was less than 1 hour ago.   Frequency: irregular.   Perceived severity is mild.    Fetal activity: Perceived fetal activity is normal.   Last perceived fetal movement was within the past hour.    Prenatal complications: Polyhydramnios.   Prenatal Complications - Diabetes: none.    Dilation: Closed Station: -2 Exam by:: J Mbugua RN Blood pressure 104/69, pulse 71, temperature 99.1 F (37.3 C), temperature source Oral, resp. rate 18, height 5\' 3"  (1.6 m), weight 93.4 kg. Maternal Exam:  Uterine Assessment: Contraction strength is mild.  Contraction frequency is irregular.   Abdomen: Patient reports no abdominal tenderness. Fetal presentation: vertex  Introitus: Normal vulva.  Normal vagina.  Ferning test: not done.  Nitrazine test: not done.  Pelvis: adequate for delivery.   Cervix: Cervix evaluated by digital exam.     Physical Exam  Nursing note and vitals reviewed. Constitutional: She is oriented to person, place, and time. She appears well-developed and well-nourished.  HENT:  Head: Normocephalic and atraumatic.  Cardiovascular: Normal rate and regular rhythm.  Respiratory: Effort normal and breath sounds normal.  GI: Soft. Bowel sounds are normal.  Genitourinary:    Vulva, vagina and uterus normal.   Musculoskeletal:        General: Normal range of motion.     Cervical back: Normal range of motion and neck supple.  Neurological: She is alert and oriented to person, place, and time. She has normal reflexes.  Skin: Skin is dry.  Psychiatric: She has a normal mood and affect.    Prenatal labs: ABO, Rh: --/--/O POS Performed at [facility], [address], [place], [place]  [phone] [id number]) Antibody: NEG (03/21 05-24-1991) Rubella: Immune (08/25 0000) RPR: Nonreactive (08/25 0000)  HBsAg: Negative (08/25 0000)  HIV: Non-reactive (08/25 0000)  GBS: Negative/-- (03/04 0000)   Assessment/Plan: 38+ week IUP Cholestasis of pregnancy Mild polyhydrammios IOL   Shazia Mitchener J 02/22/2020, 9:04 AM

## 2020-02-22 NOTE — Progress Notes (Signed)
Tracy Alexander is a 34 y.o. G2P1001 at [redacted]w[redacted]d by LMP admitted for induction of labor due to ICP.  Subjective: NO itching this am Contractions regular but not painful  Objective: BP 104/68   Pulse 75   Temp 99.1 F (37.3 C) (Oral)   Resp 16   Ht 5\' 3"  (1.6 m)   Wt 93.4 kg   BMI 36.46 kg/m  No intake/output data recorded. No intake/output data recorded.  FHT:  FHR: 155 bpm, variability: moderate,  accelerations:  Present,  decelerations:  Absent UC:   irregular, every 2-4 minutes SVE:   Dilation: 2 Effacement (%): 50 Station: -2 Exam by:: Dr. [email or web address]  AROM- clear  Labs: Lab Results  Component Value Date   WBC 8.4 02/22/2020   HGB 10.4 (L) 02/22/2020   HCT 32.1 (L) 02/22/2020   MCV 86.5 02/22/2020   PLT 146 (L) 02/22/2020    Assessment / Plan: Induction of labor due to ICP,  progressing well on pitocin  Labor: Progressing normally Preeclampsia:  no signs or symptoms of toxicity Fetal Wellbeing:  Category I Pain Control:  Labor support without medications I/D:  n/a Anticipated MOD:  NSVD  Tracy Alexander J 02/22/2020, 12:11 PM

## 2020-02-22 NOTE — Anesthesia Preprocedure Evaluation (Signed)
Anesthesia Evaluation  Patient identified by MRN, date of birth, ID band Patient awake    Reviewed: Allergy & Precautions, H&P , NPO status , Patient's Chart, lab work & pertinent test results, reviewed documented beta blocker date and time   Airway Mallampati: II  TM Distance: >3 FB Neck ROM: full    Dental no notable dental hx.    Pulmonary neg pulmonary ROS,    Pulmonary exam normal breath sounds clear to auscultation       Cardiovascular + CAD  negative cardio ROS Normal cardiovascular exam Rhythm:regular Rate:Normal     Neuro/Psych negative neurological ROS  negative psych ROS   GI/Hepatic negative GI ROS, Neg liver ROS,   Endo/Other  negative endocrine ROS  Renal/GU negative Renal ROS  negative genitourinary   Musculoskeletal   Abdominal   Peds  Hematology negative hematology ROS (+)   Anesthesia Other Findings   Reproductive/Obstetrics (+) Pregnancy                             Anesthesia Physical Anesthesia Plan  ASA: II  Anesthesia Plan: Epidural   Post-op Pain Management:    Induction:   PONV Risk Score and Plan:   Airway Management Planned:   Additional Equipment:   Intra-op Plan:   Post-operative Plan:   Informed Consent: I have reviewed the patients History and Physical, chart, labs and discussed the procedure including the risks, benefits and alternatives for the proposed anesthesia with the patient or authorized representative who has indicated his/her understanding and acceptance.     Dental Advisory Given  Plan Discussed with: Anesthesiologist  Anesthesia Plan Comments: (Labs checked- platelets confirmed with RN in room. Fetal heart tracing, per RN, reported to be stable enough for sitting procedure. Discussed epidural, and patient consents to the procedure:  included risk of possible headache,backache, failed block, allergic reaction, and nerve injury.  This patient was asked if she had any questions or concerns before the procedure started.)        Anesthesia Quick Evaluation

## 2020-02-23 ENCOUNTER — Encounter (HOSPITAL_COMMUNITY): Payer: Self-pay | Admitting: Obstetrics and Gynecology

## 2020-02-23 ENCOUNTER — Inpatient Hospital Stay (HOSPITAL_COMMUNITY): Payer: Managed Care, Other (non HMO)

## 2020-02-23 MED ORDER — PRENATAL MULTIVITAMIN CH
1.0000 | ORAL_TABLET | Freq: Every day | ORAL | Status: DC
  Administered 2020-02-23: 1 via ORAL

## 2020-02-23 MED ORDER — TRANEXAMIC ACID-NACL 1000-0.7 MG/100ML-% IV SOLN: 1000.0000 mg | INTRAVENOUS | Status: AC

## 2020-02-23 MED ORDER — SIMETHICONE 80 MG PO CHEW: 80.0000 mg | CHEWABLE_TABLET | ORAL | Status: DC | PRN

## 2020-02-23 MED ORDER — ONDANSETRON HCL 4 MG/2ML IJ SOLN: 4.0000 mg | INTRAMUSCULAR | Status: DC | PRN

## 2020-02-23 MED ORDER — ONDANSETRON HCL 4 MG PO TABS: 4.0000 mg | ORAL_TABLET | ORAL | Status: DC | PRN

## 2020-02-23 MED ORDER — ACETAMINOPHEN 325 MG PO TABS
650.0000 mg | ORAL_TABLET | ORAL | Status: DC | PRN
  Administered 2020-02-23: 650 mg via ORAL
  Filled 2020-02-23: qty 2

## 2020-02-23 MED ORDER — IBUPROFEN 600 MG PO TABS
600.0000 mg | ORAL_TABLET | Freq: Four times a day (QID) | ORAL | Status: DC
  Administered 2020-02-23 – 2020-02-24 (×4): 600 mg via ORAL
  Filled 2020-02-23 (×4): qty 1

## 2020-02-23 MED ORDER — METHYLERGONOVINE MALEATE 0.2 MG/ML IJ SOLN: 0.2000 mg | INTRAMUSCULAR | Status: DC | PRN

## 2020-02-23 MED ORDER — DIPHENHYDRAMINE HCL 25 MG PO CAPS: 25.0000 mg | ORAL_CAPSULE | Freq: Four times a day (QID) | ORAL | Status: DC | PRN

## 2020-02-23 MED ORDER — WITCH HAZEL-GLYCERIN EX PADS: 1.0000 "application " | MEDICATED_PAD | CUTANEOUS | Status: DC | PRN

## 2020-02-23 MED ORDER — METHYLERGONOVINE MALEATE 0.2 MG PO TABS: 0.2000 mg | ORAL_TABLET | ORAL | Status: DC | PRN

## 2020-02-23 MED ORDER — TRANEXAMIC ACID-NACL 1000-0.7 MG/100ML-% IV SOLN
INTRAVENOUS | Status: AC
  Filled 2020-02-23: qty 100

## 2020-02-23 MED ORDER — TRANEXAMIC ACID-NACL 1000-0.7 MG/100ML-% IV SOLN: 1000.0000 mg | Freq: Once | INTRAVENOUS | Status: DC | PRN

## 2020-02-23 MED ORDER — SENNOSIDES-DOCUSATE SODIUM 8.6-50 MG PO TABS
2.0000 | ORAL_TABLET | ORAL | Status: DC
  Administered 2020-02-23: 2 via ORAL
  Filled 2020-02-23: qty 2

## 2020-02-23 MED ORDER — DIBUCAINE (PERIANAL) 1 % EX OINT: 1.0000 "application " | TOPICAL_OINTMENT | CUTANEOUS | Status: DC | PRN

## 2020-02-23 MED ORDER — TETANUS-DIPHTH-ACELL PERTUSSIS 5-2.5-18.5 LF-MCG/0.5 IM SUSP: 0.5000 mL | Freq: Once | INTRAMUSCULAR | Status: DC

## 2020-02-23 MED ORDER — OXYCODONE-ACETAMINOPHEN 5-325 MG PO TABS
1.0000 | ORAL_TABLET | ORAL | Status: DC | PRN
  Administered 2020-02-23 – 2020-02-24 (×3): 1 via ORAL
  Filled 2020-02-23 (×3): qty 1

## 2020-02-23 MED ORDER — OXYCODONE-ACETAMINOPHEN 5-325 MG PO TABS: 2.0000 | ORAL_TABLET | ORAL | Status: DC | PRN

## 2020-02-23 MED ORDER — COCONUT OIL OIL: 1.0000 "application " | TOPICAL_OIL | Status: DC | PRN

## 2020-02-23 MED ORDER — BENZOCAINE-MENTHOL 20-0.5 % EX AERO: 1.0000 "application " | INHALATION_SPRAY | CUTANEOUS | Status: DC | PRN

## 2020-02-23 MED ORDER — ZOLPIDEM TARTRATE 5 MG PO TABS: 5.0000 mg | ORAL_TABLET | Freq: Every evening | ORAL | Status: DC | PRN

## 2020-02-23 NOTE — Lactation Note (Signed)
This note was copied from a baby's chart. Lactation Consultation Note:  Mother is a P2, infant is 8 hours   Mother was given [facility] brochure and basic teaching done.  Mother reports that she breastfed her first child for 6 months. Mother assist with hand expression and obseved drops of colostrum. Mother attempting to latch infant when I arrived to the room.  Infant very sleepy. Assist with STS .  Mother reports that she has had one good 20 mins feeding.  Teaching on proper latch with good depth.   Mother to continue to cue base feed infant and feed at least 8-12 times or more in 24 hours and advised to allow for cluster feeding infant as needed.   Mother to continue to due STS. Mother is aware of available LC services at [facility], BFSG'S, OP Dept, and phone # for questions or concerns about breastfeeding.  Mother receptive to all teaching and plan of care.    Patient Name: Tracy Alexander Today's Date: 02/23/2020 Reason for consult: Initial assessment   Maternal Data Has patient been taught Hand Expression?: Yes Does the patient have breastfeeding experience prior to this delivery?: Yes  Feeding Feeding Type: Breast Fed  LATCH Score                   Interventions Interventions: Breast feeding basics reviewed;Assisted with latch;Skin to skin;Hand express;Pre-pump if needed  Lactation Tools Discussed/Used     Consult Status Consult Status: Follow-up Date: 02/24/20 Follow-up type: In-patient    Stevan Born [facility] 02/23/2020, 2:49 PM

## 2020-02-23 NOTE — Anesthesia Postprocedure Evaluation (Signed)
Anesthesia Post Note  Patient: Tracy Alexander  Procedure(s) Performed: AN AD HOC LABOR EPIDURAL     Patient location during evaluation: Mother Baby Anesthesia Type: Epidural Level of consciousness: awake Pain management: satisfactory to patient Vital Signs Assessment: post-procedure vital signs reviewed and stable Respiratory status: spontaneous breathing Cardiovascular status: stable Anesthetic complications: no    Last Vitals:  Vitals:   02/23/20 0824 02/23/20 0922  BP: (!) 96/55 105/74  Pulse: 69   Resp: 18 18  Temp: 36.9 C 36.7 C  SpO2: 100%     Last Pain:  Vitals:   02/23/20 0922  TempSrc:   PainSc: 0-No pain   Pain Goal: Patients Stated Pain Goal: 1 (02/22/20 1455)                 Cephus Shelling

## 2020-02-23 NOTE — Progress Notes (Signed)
Tracy Alexander is a 34 y.o. G2P1001 at [redacted]w[redacted]d by LMP admitted for induction of labor due to ICP.  Subjective: NO itching  Comfortable with epidural  Objective: BP (!) 99/52   Pulse 70   Temp 98 F (36.7 C) (Oral)   Resp 15   Ht 5\' 3"  (1.6 m)   Wt 93.4 kg   SpO2 100%   BMI 36.46 kg/m  I/O last 3 completed shifts: In: -  Out: 450 [Urine:450] Total I/O In: -  Out: 1050 [Urine:1050]  FHT:  120s, Category 1 tracing UC:   irregular, every 2-4 minutes SVE:   deferred MVU adequate by IUPC Labs: Lab Results  Component Value Date   WBC 8.4 02/22/2020   HGB 10.4 (L) 02/22/2020   HCT 32.1 (L) 02/22/2020   MCV 86.5 02/22/2020   PLT 146 (L) 02/22/2020    Assessment / Plan: Induction of labor due to ICP,  progressing well on pitocin  Prolonged latent phase  Labor: long latent phase Preeclampsia:  no signs or symptoms of toxicity Fetal Wellbeing:  Category I Pain Control:  Labor support without medications I/D:  n/a Anticipated MOD:  NSVD  Tracy Alexander J 02/23/2020, 3:45 AM

## 2020-02-23 NOTE — Lactation Note (Signed)
This note was copied from a baby's chart. Lactation Consultation Note  Patient Name: Tracy Alexander [id number] Date: 02/23/2020 Reason for consult: Follow-up assessment;Other (Comment)(Mother baby page)  I followed up with Ms. Pennie Banter upon mother baby page. She states that her daughter latched earlier and had been breast feeding well. She denied concerns. After speaking for a few minutes, I asked if she had paged lactation, and she indicated that she had not. I answered a few questions and praised her for doing a good job. I then followed up with Mother Baby to correct room request.   Feeding Feeding Type: Breast Fed   Interventions Interventions: Breast feeding basics reviewed   Consult Status Consult Status: Follow-up Date: 02/24/20 Follow-up type: In-patient    Walker Shadow 02/23/2020, 11:05 PM

## 2020-02-24 LAB — CBC
HCT: 31 % — ABNORMAL LOW (ref 36.0–46.0)
Hemoglobin: 10.2 g/dL — ABNORMAL LOW (ref 12.0–15.0)
MCH: 28.6 pg (ref 26.0–34.0)
MCHC: 32.9 g/dL (ref 30.0–36.0)
MCV: 86.8 fL (ref 80.0–100.0)
Platelets: 146 10*3/uL — ABNORMAL LOW (ref 150–400)
RBC: 3.57 MIL/uL — ABNORMAL LOW (ref 3.87–5.11)
RDW: 13.1 % (ref 11.5–15.5)
WBC: 10.5 10*3/uL (ref 4.0–10.5)
nRBC: 0 % (ref 0.0–0.2)

## 2020-02-24 MED ORDER — IBUPROFEN 600 MG PO TABS: 600.0000 mg | ORAL_TABLET | Freq: Four times a day (QID) | ORAL | 0 refills | Status: DC

## 2020-02-24 MED ORDER — MAGNESIUM OXIDE 400 (241.3 MG) MG PO TABS: 400.0000 mg | ORAL_TABLET | Freq: Every day | ORAL | 3 refills | Status: DC

## 2020-02-24 MED ORDER — POLYSACCHARIDE IRON COMPLEX 150 MG PO CAPS: 150.0000 mg | ORAL_CAPSULE | Freq: Every day | ORAL | Status: DC

## 2020-02-24 MED ORDER — POLYSACCHARIDE IRON COMPLEX 150 MG PO CAPS: 150.0000 mg | ORAL_CAPSULE | Freq: Every day | ORAL | 3 refills | Status: DC

## 2020-02-24 MED ORDER — OXYCODONE-ACETAMINOPHEN 5-325 MG PO TABS: 1.0000 | ORAL_TABLET | ORAL | 0 refills | Status: DC | PRN

## 2020-02-24 MED ORDER — MAGNESIUM OXIDE 400 (241.3 MG) MG PO TABS: 400.0000 mg | ORAL_TABLET | Freq: Every day | ORAL | Status: DC

## 2020-02-24 NOTE — Progress Notes (Signed)
PPD #1, SVD, 2nd degree repair, baby girl  "CC" (Chelsey Dunlap)  S:  Reports feeling well, pain is improving, but required Percocet overnight for cramping. Desires to go home today   Denies HA, visual changes, RUQ/epigastric pain, itching             Tolerating po/ No nausea or vomiting / Denies dizziness or SOB             Bleeding is light             Pain controlled with Motrin and Percocet             Up ad lib / ambulatory / voiding QS without difficulty   Newborn breast feeding - going well, seeing good colostrum   O:               VS: BP 92/60 (BP Location: Left Arm)   Pulse 69   Temp 97.6 F (36.4 C) (Oral)   Resp 19   Ht 5\' 3"  (1.6 m)   Wt 93.4 kg   SpO2 97%   Breastfeeding Unknown   BMI 36.46 kg/m    LABS:              Recent Labs    02/22/20 0702 02/24/20 0541  WBC 8.4 10.5  HGB 10.4* 10.2*  PLT 146* 146*               Blood type: --/--/O POS Performed at [facility], [address], [place], [place]  [phone]/21 0708)  Rubella: Immune (08/25 0000)                     I&O: Intake/Output      03/22 0701 - 03/23 0700 03/23 0701 - 03/24 0700   Urine (mL/kg/hr)     Blood     Total Output     Net          Urine Occurrence 1 x                  Physical Exam:             Alert and oriented X3  Lungs: Clear and unlabored  Heart: regular rate and rhythm / no murmurs  Abdomen: soft, non-tender, non-distended              Fundus: firm, non-tender, U-1  Perineum: well approximated 2nd degree repair, mild edema, no erythema   Lochia: scant on pad  Extremities: trace LE edema, no calf pain or tenderness    A/P: PPD # 1, SVD  2nd degree repair  Cholestasis of pregnancy     - itching resolved  Gestational thrombocytopenia    - plts stable  ABL Anemia compounding chronic IDA   - Niferex 150mg  PO daily   - Magnesium oxide 400mg  PO daily  Doing well - stable status  Routine post partum orders  Discharge home today  WOB discharge book  given, instructions and warning s/s reviewed  F/u in 6 weeks for PP visit   4/24, MSN, CNM [facility]

## 2020-02-24 NOTE — Discharge Summary (Signed)
Obstetric Discharge Summary   Patient Name: Tracy Alexander DOB: 1986/07/22 MRN: [id number]  Date of Admission: 02/22/2020 Date of Discharge: 02/24/2020 Date of Delivery: 02/23/2020 Gestational Age at Delivery: [redacted]w[redacted]d  Primary OB: [facility] - Dr. Ronita Hipps   Antepartum complications:  - Cholestasis  - Mild Polyhydramnios  - Anxiety off medication  Prenatal Labs:  ABO, Rh: --/--/O POS Performed at [facility], [place] [address] [place], [place] [address]  [phone]/21 0708) Antibody: NEG (03/21 7619) Rubella: Immune (08/25 0000) RPR: Nonreactive (08/25 0000)  HBsAg: Negative (08/25 0000)  HIV: Non-reactive (08/25 0000)  GBS: Negative/-- (03/04 0000)   Admitting Diagnosis: IOL at 38+ weeks for cholestasis   Secondary Diagnoses: Patient Active Problem List   Diagnosis Date Noted  . SVD (spontaneous vaginal delivery) 02/23/2020  . Postpartum care following vaginal delivery 3/22 02/23/2020  . Perineal laceration, second degree 02/23/2020  . Encounter for planned induction of labor 02/22/2020  . ANXIETY DISORDER 08/07/2008  . ABFND PAP SMEAR LGSIL 07/25/2007  . ADD 07/18/2007  . DEPRESSION 06/28/2007    Augmentation: Pitocin and AROM Complications: None  Date of Delivery: 02/23/2020 Delivered By: Dr. Ronita Hipps Delivery Type: spontaneous vaginal delivery Anesthesia: epidural Placenta: sponatneous Laceration:  Episiotomy: none  Newborn Data: Live born female  Birth Weight: 7 lb 10.2 oz (3465 g) APGAR: 7, 9  Newborn Delivery   Birth date/time: 02/23/2020 06:26:00 Delivery type: Vaginal, Spontaneous         Hospital/Postpartum Course  (Vaginal Delivery): Pt. Admitted at 38+6 weeks for IOL for cholestasis of pregnancy. She progressed with Pitocin and AROM and delivered by NSVD.  See notes and delivery summary for details. Patient had an uncomplicated postpartum course.  By time of discharge on PPD#1 , her pain was controlled on oral pain medications; she had  appropriate lochia and was ambulating, voiding without difficulty and tolerating regular diet.  She was deemed stable for discharge to home.   Labs: CBC Latest Ref Rng & Units 02/24/2020 02/22/2020  WBC 4.0 - 10.5 K/uL 10.5 8.4  Hemoglobin 12.0 - 15.0 g/dL 10.2(L) 10.4(L)  Hematocrit 36.0 - 46.0 % 31.0(L) 32.1(L)  Platelets 150 - 400 K/uL 146(L) 146(L)   O POS Performed at [facility] [address] [place],  [address]   Physical exam:  BP 92/60 (BP Location: Left Arm)   Pulse 69   Temp 97.6 F (36.4 C) (Oral)   Resp 19   Ht 5\' 3"  (1.6 m)   Wt 93.4 kg   SpO2 97%   Breastfeeding Unknown   BMI 36.46 kg/m   Alert and oriented X3  Lungs: Clear and unlabored  Heart: regular rate and rhythm / no murmurs  Abdomen: soft, non-tender, non-distended              Fundus: firm, non-tender, U-1  Perineum: well approximated 2nd degree repair, mild edema, no erythema   Lochia: scant on pad  Extremities: trace LE edema, no calf pain or tenderness  Disposition: stable, discharge to home Baby Feeding: breast milk Baby Disposition: home with mom  Contraception: not discussed  Rh Immune globulin given: N/A Rubella vaccine given: N/A Tdap vaccine given in AP or PP setting: UTD Flu vaccine given in AP or PP setting: UTD   Plan:  Awilda Bill was discharged to home in good condition. Follow-up appointment at [facility] in 6 weeks.  Discharge Instructions: Per After Visit Summary. Refer to After Visit Summary and [facility] discharge booklet  Activity: Advance as tolerated. Pelvic rest for 6 weeks.   Diet: Regular, Heart Healthy Discharge Medications: Allergies as of 02/24/2020      Reactions   Penicillins Hives   Did it involve swelling of the face/tongue/throat, SOB, or low BP? Yes Did it involve sudden or severe rash/hives, skin peeling, or any reaction on the inside of your mouth or nose? No Did you need to seek medical attention at a hospital or  doctor's office? No When did it last happen?childhood If all above answers are "NO", may proceed with cephalosporin use.   Cefaclor Nausea And Vomiting, Rash   Sulfa Antibiotics Hives      Medication List    STOP taking these medications   hydrOXYzine 50 MG capsule Commonly known as: VISTARIL     TAKE these medications   cetirizine 10 MG tablet Commonly known as: ZYRTEC Take 10 mg by mouth daily.   diphenhydrAMINE 25 MG tablet Commonly known as: BENADRYL Take 25 mg by mouth at bedtime as needed for sleep.   ibuprofen 600 MG tablet Commonly known as: ADVIL Take 1 tablet (600 mg total) by mouth every 6 (six) hours.   iron polysaccharides 150 MG capsule Commonly known as: NIFEREX Take 1 capsule (150 mg total) by mouth daily.   magnesium oxide 400 (241.3 Mg) MG tablet Commonly known as: MAG-OX Take 1 tablet (400 mg total) by mouth daily.   oxyCODONE-acetaminophen 5-325 MG tablet Commonly known as: PERCOCET/ROXICET Take 1 tablet by mouth every 4 (four) hours as needed (pain scale 4-7).   prenatal multivitamin Tabs tablet Take 1 tablet by mouth daily at 12 noon.            Discharge Care Instructions  (From admission, onward)         Start     Ordered   02/24/20 0000  Discharge wound care:    Comments: Warm water sitz baths 2-3 times per day as needed   02/24/20 1010         Outpatient follow up:  Follow-up Information    Olivia Mackie, MD. Schedule an appointment as soon as possible for a visit in 6 week(s).   Specialty: Obstetrics and Gynecology Why: Postpartum visit Contact information: [address] [place] [address] [phone]           Signed:  Carlean Jews, MSN, CNM [facility]

## 2020-02-24 NOTE — Lactation Note (Addendum)
This note was copied from a baby's chart. Lactation Consultation Note:  Mother reports that she is having slight tenderness on her nipples  Mother was given comfort gels.  Mother was given a harmony hand pump.infomed mother of prevention and treatment of engorgment. Advised mother to cue base feed and to feed infant at least 8-12 times in 24 hours. Mother is aware of all avialble LC services and community.  Patient Name: Tracy Alexander [id number]'[id number] Date: 02/24/2020 Reason for consult: Follow-up assessment   Maternal Data    Feeding    LATCH Score                   Interventions    Lactation Tools Discussed/Used     Consult Status Consult Status: Complete    Michel Bickers 02/24/2020, 10:34 AM

## 2020-02-24 NOTE — Progress Notes (Signed)
MOB was referred for history of depression/anxiety. * Referral screened out by Clinical Social Worker because none of the following criteria appear to apply: ~ History of anxiety/depression during this pregnancy, or of post-partum depression following prior delivery. ~ Diagnosis of anxiety and/or depression within last 3 years. Per further chart review, It appears that MOB was diagnosed with depression/aniety in 2008 and in 2009.  OR * MOB's symptoms currently being treated with medication and/or therapy.   Please contact the Clinical Social Worker if needs arise, by MOB request, or if MOB scores greater than 9/yes to question 10 on Edinburgh Postpartum Depression Screen.      Deddrick Saindon S. Karmon Andis, MSW, LCSW [facility] [phone]  

## 2020-12-04 NOTE — L&D Delivery Note (Signed)
Delivery Note At 7:44 PM a viable and healthy female was delivered via Vaginal, Spontaneous (Presentation:  LOA    ).  APGAR: 9, 9; weight  pending.   Placenta status: Spontaneous, Intact.  Cord: 3 vessels with the following complications: None.  Cord pH: na  Anesthesia: Epidural Episiotomy: None Lacerations: None Suture Repair: 3.0 vicryl rapide Est. Blood Loss (mL): 100  Mom to postpartum.  Baby to Couplet care / Skin to Skin.  Tracy Alexander 10/30/2021, 8:11 PM

## 2021-01-08 ENCOUNTER — Other Ambulatory Visit: Payer: Self-pay

## 2021-01-08 ENCOUNTER — Emergency Department (HOSPITAL_COMMUNITY)
Admission: EM | Admit: 2021-01-08 | Discharge: 2021-01-09 | Disposition: A | Discharge status: Home / Self Care | Payer: Managed Care, Other (non HMO) | Attending: Emergency Medicine | Admitting: Emergency Medicine

## 2021-01-08 DIAGNOSIS — Z5321 Procedure and treatment not carried out due to patient leaving prior to being seen by health care provider: Secondary | ICD-10-CM | POA: Diagnosis not present

## 2021-01-08 DIAGNOSIS — R1010 Upper abdominal pain, unspecified: Secondary | ICD-10-CM | POA: Insufficient documentation

## 2021-01-08 DIAGNOSIS — K920 Hematemesis: Secondary | ICD-10-CM | POA: Diagnosis not present

## 2021-01-08 LAB — I-STAT BETA HCG BLOOD, ED (MC, WL, AP ONLY): I-stat hCG, quantitative: <5 m[IU]/mL

## 2021-01-08 LAB — COMPREHENSIVE METABOLIC PANEL
ALT: 16 U/L (ref 0–44)
AST: 19 U/L (ref 15–41)
Albumin: 4.2 g/dL (ref 3.5–5.0)
Alkaline Phosphatase: 47 U/L (ref 38–126)
Anion gap: 13 (ref 5–15)
BUN: 16 mg/dL (ref 6–20)
CO2: 25 mmol/L (ref 22–32)
Calcium: 9.1 mg/dL (ref 8.9–10.3)
Chloride: 100 mmol/L (ref 98–111)
Creatinine, Ser: 0.69 mg/dL (ref 0.44–1.00)
GFR, Estimated: >60 mL/min (ref >60)
Glucose, Bld: 111 mg/dL — ABNORMAL HIGH (ref 70–99)
Potassium: 3.8 mmol/L (ref 3.5–5.1)
Sodium: 138 mmol/L (ref 135–145)
Total Bilirubin: 0.9 mg/dL (ref 0.3–1.2)
Total Protein: 7 g/dL (ref 6.5–8.1)

## 2021-01-08 LAB — CBC
HCT: 45.2 % (ref 36.0–46.0)
Hemoglobin: 15.2 g/dL — ABNORMAL HIGH (ref 12.0–15.0)
MCH: 29.9 pg (ref 26.0–34.0)
MCHC: 33.6 g/dL (ref 30.0–36.0)
MCV: 88.8 fL (ref 80.0–100.0)
Platelets: 186 K/uL (ref 150–400)
RBC: 5.09 MIL/uL (ref 3.87–5.11)
RDW: 11.8 % (ref 11.5–15.5)
WBC: 9.4 K/uL (ref 4.0–10.5)
nRBC: 0 % (ref 0.0–0.2)

## 2021-01-08 LAB — LIPASE, BLOOD: Lipase: 22 U/L (ref 11–51)

## 2021-01-08 MED ORDER — ONDANSETRON 4 MG PO TBDP
4.0000 mg | ORAL_TABLET | Freq: Once | ORAL | Status: DC | PRN
  Filled 2021-01-08: qty 1

## 2021-01-08 NOTE — ED Triage Notes (Signed)
Pt presents to ED POV. Pt c/o upper abd pain and hematemesis. Pt reports that she had one drink of alcohol and pain began. Pt states that she took zofran at home w/o relief.

## 2021-01-08 NOTE — ED Notes (Signed)
Pt's husband asking for water for the patient. The husband was informed that she is NPO since she is here for abdominal pain and emesis. Pt stated that she will be "dehydrated" if she doesn't get water.

## 2021-01-09 LAB — URINALYSIS, ROUTINE W REFLEX MICROSCOPIC
Bilirubin Urine: NEGATIVE
Glucose, UA: NEGATIVE mg/dL
Hgb urine dipstick: NEGATIVE
Ketones, ur: 80 mg/dL — AB
Leukocytes,Ua: NEGATIVE
Nitrite: NEGATIVE
Protein, ur: 30 mg/dL — AB
Specific Gravity, Urine: 1.032 — ABNORMAL HIGH (ref 1.005–1.030)
pH: 6 (ref 5.0–8.0)

## 2021-01-09 NOTE — ED Notes (Signed)
Pt seen leaving ED with husband.

## 2021-04-13 LAB — OB RESULTS CONSOLE HIV ANTIBODY (ROUTINE TESTING): HIV: NONREACTIVE

## 2021-04-13 LAB — OB RESULTS CONSOLE RUBELLA ANTIBODY, IGM: Rubella: IMMUNE

## 2021-04-13 LAB — OB RESULTS CONSOLE HEPATITIS B SURFACE ANTIGEN: Hepatitis B Surface Ag: NEGATIVE

## 2021-04-19 ENCOUNTER — Other Ambulatory Visit: Payer: Self-pay

## 2021-05-05 LAB — OB RESULTS CONSOLE GC/CHLAMYDIA
Chlamydia: NEGATIVE
Gonorrhea: NEGATIVE

## 2021-06-08 ENCOUNTER — Other Ambulatory Visit: Payer: Self-pay | Admitting: Obstetrics and Gynecology

## 2021-06-08 DIAGNOSIS — Z363 Encounter for antenatal screening for malformations: Secondary | ICD-10-CM

## 2021-06-08 DIAGNOSIS — O09899 Supervision of other high risk pregnancies, unspecified trimester: Secondary | ICD-10-CM

## 2021-06-08 DIAGNOSIS — Z3A19 19 weeks gestation of pregnancy: Secondary | ICD-10-CM

## 2021-06-08 DIAGNOSIS — O09522 Supervision of elderly multigravida, second trimester: Secondary | ICD-10-CM

## 2021-06-09 ENCOUNTER — Other Ambulatory Visit: Payer: Self-pay

## 2021-06-13 ENCOUNTER — Ambulatory Visit: Payer: Managed Care, Other (non HMO) | Attending: Obstetrics and Gynecology

## 2021-06-13 ENCOUNTER — Ambulatory Visit: Payer: Managed Care, Other (non HMO) | Admitting: *Deleted

## 2021-06-13 ENCOUNTER — Other Ambulatory Visit: Payer: Self-pay | Admitting: *Deleted

## 2021-06-13 ENCOUNTER — Other Ambulatory Visit: Payer: Self-pay

## 2021-06-13 ENCOUNTER — Ambulatory Visit (HOSPITAL_BASED_OUTPATIENT_CLINIC_OR_DEPARTMENT_OTHER): Payer: Managed Care, Other (non HMO) | Admitting: Obstetrics

## 2021-06-13 ENCOUNTER — Encounter: Payer: Self-pay | Admitting: *Deleted

## 2021-06-13 VITALS — BP 105/63 | HR 77

## 2021-06-13 DIAGNOSIS — O09522 Supervision of elderly multigravida, second trimester: Secondary | ICD-10-CM

## 2021-06-13 DIAGNOSIS — Z3A19 19 weeks gestation of pregnancy: Secondary | ICD-10-CM | POA: Diagnosis not present

## 2021-06-13 DIAGNOSIS — O99212 Obesity complicating pregnancy, second trimester: Secondary | ICD-10-CM | POA: Diagnosis not present

## 2021-06-13 DIAGNOSIS — O280 Abnormal hematological finding on antenatal screening of mother: Secondary | ICD-10-CM | POA: Diagnosis not present

## 2021-06-13 DIAGNOSIS — E669 Obesity, unspecified: Secondary | ICD-10-CM

## 2021-06-13 DIAGNOSIS — R772 Abnormality of alphafetoprotein: Secondary | ICD-10-CM

## 2021-06-13 DIAGNOSIS — O281 Abnormal biochemical finding on antenatal screening of mother: Secondary | ICD-10-CM | POA: Diagnosis not present

## 2021-06-13 DIAGNOSIS — Z3689 Encounter for other specified antenatal screening: Secondary | ICD-10-CM

## 2021-06-13 DIAGNOSIS — O09899 Supervision of other high risk pregnancies, unspecified trimester: Secondary | ICD-10-CM | POA: Diagnosis present

## 2021-06-13 DIAGNOSIS — Z361 Encounter for antenatal screening for raised alphafetoprotein level: Secondary | ICD-10-CM | POA: Diagnosis not present

## 2021-06-13 DIAGNOSIS — Z363 Encounter for antenatal screening for malformations: Secondary | ICD-10-CM | POA: Diagnosis not present

## 2021-06-13 NOTE — Progress Notes (Signed)
MFM Note  Tracy Alexander was seen in consultation due to an elevated MSAFP of 2.75 MoM and due to advanced maternal age.  She reports no complications in her current pregnancy.  She denies any significant past medical or family history.    She had a cell free DNA test drawn earlier in her pregnancy that indicated a low risk for trisomy 69, 64, and 13.  A female fetus is predicted.  The fetal growth and amniotic fluid level appears appropriate for her gestational age.  The patient was advised of the ultrasound findings that failed to reveal an anatomical cause for the increased MSAFP.  There were no sonographic signs of spina bifida or an anterior abdominal wall defect noted today.  A normal-appearing posterior placenta is noted.    She was advised regarding the limitations of ultrasound in the detection of all anomalies and that it will diagnose approximately 90% of neural tube defects.  The association of an elevated MSAFP with placental dysfunction which may manifest later in her pregnancy as fetal growth restriction along with with other adverse pregnancy outcomes such as fetal demise was discussed.  Due to the elevated MSAFP and advanced maternal age, the patient was offered and declined an amniocentesis today for definitive diagnosis of fetal aneuploidy and spina bifida.  Due to the elevated MSAFP, she should continue to be followed with growth ultrasounds every 4 to 5 weeks.  These ultrasounds may be performed in your office or ours.  Weekly fetal testing should be started at around 32 weeks and continued until delivery.  She should probably be delivered at around 39 weeks.    A follow-up exam was scheduled in our office in 4 weeks to reassess the views of the fetal anatomy.  Following that exam, she may have her subsequent growth scans performed in your office.  A total of 30 minutes was spent counseling and coordinating the care for this patient.  Greater than 50% of the time was spent in  direct face-to-face contact.

## 2021-07-11 ENCOUNTER — Encounter: Payer: Self-pay | Admitting: *Deleted

## 2021-07-11 ENCOUNTER — Ambulatory Visit: Payer: Managed Care, Other (non HMO) | Attending: Obstetrics

## 2021-07-11 ENCOUNTER — Other Ambulatory Visit: Payer: Self-pay

## 2021-07-11 ENCOUNTER — Ambulatory Visit: Payer: Managed Care, Other (non HMO) | Admitting: *Deleted

## 2021-07-11 VITALS — BP 100/59 | HR 67

## 2021-07-11 DIAGNOSIS — Z362 Encounter for other antenatal screening follow-up: Secondary | ICD-10-CM | POA: Diagnosis not present

## 2021-07-11 DIAGNOSIS — R772 Abnormality of alphafetoprotein: Secondary | ICD-10-CM | POA: Insufficient documentation

## 2021-07-11 DIAGNOSIS — O281 Abnormal biochemical finding on antenatal screening of mother: Secondary | ICD-10-CM

## 2021-07-11 DIAGNOSIS — O09522 Supervision of elderly multigravida, second trimester: Secondary | ICD-10-CM | POA: Insufficient documentation

## 2021-07-11 DIAGNOSIS — Z361 Encounter for antenatal screening for raised alphafetoprotein level: Secondary | ICD-10-CM

## 2021-07-11 DIAGNOSIS — Z3A23 23 weeks gestation of pregnancy: Secondary | ICD-10-CM

## 2021-07-11 DIAGNOSIS — O99212 Obesity complicating pregnancy, second trimester: Secondary | ICD-10-CM | POA: Diagnosis not present

## 2021-07-11 DIAGNOSIS — E669 Obesity, unspecified: Secondary | ICD-10-CM

## 2021-09-08 ENCOUNTER — Other Ambulatory Visit: Payer: Self-pay | Admitting: Obstetrics and Gynecology

## 2021-09-08 ENCOUNTER — Ambulatory Visit: Payer: Managed Care, Other (non HMO) | Attending: Obstetrics and Gynecology

## 2021-09-08 ENCOUNTER — Encounter: Payer: Self-pay | Admitting: *Deleted

## 2021-09-08 ENCOUNTER — Other Ambulatory Visit: Payer: Self-pay

## 2021-09-08 ENCOUNTER — Ambulatory Visit: Payer: Managed Care, Other (non HMO) | Admitting: *Deleted

## 2021-09-08 VITALS — BP 110/70 | HR 80

## 2021-09-08 DIAGNOSIS — Z3A31 31 weeks gestation of pregnancy: Secondary | ICD-10-CM

## 2021-09-08 DIAGNOSIS — O283 Abnormal ultrasonic finding on antenatal screening of mother: Secondary | ICD-10-CM | POA: Diagnosis not present

## 2021-09-08 DIAGNOSIS — O09523 Supervision of elderly multigravida, third trimester: Secondary | ICD-10-CM | POA: Insufficient documentation

## 2021-09-08 DIAGNOSIS — Z361 Encounter for antenatal screening for raised alphafetoprotein level: Secondary | ICD-10-CM | POA: Insufficient documentation

## 2021-09-08 DIAGNOSIS — O99213 Obesity complicating pregnancy, third trimester: Secondary | ICD-10-CM | POA: Insufficient documentation

## 2021-09-08 DIAGNOSIS — Z362 Encounter for other antenatal screening follow-up: Secondary | ICD-10-CM | POA: Insufficient documentation

## 2021-10-26 ENCOUNTER — Other Ambulatory Visit: Payer: Self-pay | Admitting: Obstetrics and Gynecology

## 2021-10-30 ENCOUNTER — Encounter (HOSPITAL_COMMUNITY): Payer: Self-pay | Admitting: Obstetrics and Gynecology

## 2021-10-30 ENCOUNTER — Inpatient Hospital Stay (HOSPITAL_COMMUNITY): Payer: Managed Care, Other (non HMO) | Admitting: Anesthesiology

## 2021-10-30 ENCOUNTER — Inpatient Hospital Stay (HOSPITAL_COMMUNITY): Payer: Managed Care, Other (non HMO)

## 2021-10-30 ENCOUNTER — Inpatient Hospital Stay (HOSPITAL_COMMUNITY)
Admission: AD | Admit: 2021-10-30 | Discharge: 2021-11-01 | DRG: 805 | Disposition: A | Discharge status: Home / Self Care | Payer: Managed Care, Other (non HMO) | Attending: Obstetrics and Gynecology | Admitting: Obstetrics and Gynecology

## 2021-10-30 ENCOUNTER — Other Ambulatory Visit: Payer: Self-pay

## 2021-10-30 DIAGNOSIS — O99824 Streptococcus B carrier state complicating childbirth: Secondary | ICD-10-CM | POA: Diagnosis present

## 2021-10-30 DIAGNOSIS — Z3A39 39 weeks gestation of pregnancy: Secondary | ICD-10-CM | POA: Diagnosis not present

## 2021-10-30 DIAGNOSIS — Z20822 Contact with and (suspected) exposure to covid-19: Secondary | ICD-10-CM | POA: Diagnosis present

## 2021-10-30 DIAGNOSIS — O2662 Liver and biliary tract disorders in childbirth: Principal | ICD-10-CM | POA: Diagnosis present

## 2021-10-30 DIAGNOSIS — Z3490 Encounter for supervision of normal pregnancy, unspecified, unspecified trimester: Secondary | ICD-10-CM | POA: Diagnosis present

## 2021-10-30 DIAGNOSIS — K831 Obstruction of bile duct: Secondary | ICD-10-CM | POA: Diagnosis present

## 2021-10-30 LAB — CBC
HCT: 37.3 % (ref 36.0–46.0)
Hemoglobin: 12.9 g/dL (ref 12.0–15.0)
MCH: 30.7 pg (ref 26.0–34.0)
MCHC: 34.6 g/dL (ref 30.0–36.0)
MCV: 88.8 fL (ref 80.0–100.0)
Platelets: 146 10*3/uL — ABNORMAL LOW (ref 150–400)
RBC: 4.2 MIL/uL (ref 3.87–5.11)
RDW: 13.9 % (ref 11.5–15.5)
WBC: 7.7 10*3/uL (ref 4.0–10.5)
nRBC: 0 % (ref 0.0–0.2)

## 2021-10-30 LAB — RESP PANEL BY RT-PCR (FLU A&B, COVID) ARPGX2
Influenza A by PCR: NEGATIVE
Influenza B by PCR: NEGATIVE
SARS Coronavirus 2 by RT PCR: NEGATIVE

## 2021-10-30 LAB — OB RESULTS CONSOLE GBS: GBS: POSITIVE

## 2021-10-30 LAB — TYPE AND SCREEN
ABO/RH(D): O POS
Antibody Screen: NEGATIVE

## 2021-10-30 LAB — RPR: RPR Ser Ql: NONREACTIVE

## 2021-10-30 MED ORDER — OXYCODONE-ACETAMINOPHEN 5-325 MG PO TABS: 2.0000 | ORAL_TABLET | ORAL | Status: DC | PRN

## 2021-10-30 MED ORDER — ZOLPIDEM TARTRATE 5 MG PO TABS: 5.0000 mg | ORAL_TABLET | Freq: Every evening | ORAL | Status: DC | PRN

## 2021-10-30 MED ORDER — ACETAMINOPHEN 325 MG PO TABS
650.0000 mg | ORAL_TABLET | ORAL | Status: DC | PRN
  Administered 2021-10-31: 03:00:00 650 mg via ORAL
  Filled 2021-10-30: qty 2

## 2021-10-30 MED ORDER — LACTATED RINGERS IV SOLN: 500.0000 mL | INTRAVENOUS | Status: DC | PRN

## 2021-10-30 MED ORDER — OXYCODONE-ACETAMINOPHEN 5-325 MG PO TABS
1.0000 | ORAL_TABLET | ORAL | Status: DC | PRN
  Administered 2021-10-31 – 2021-11-01 (×4): 1 via ORAL
  Filled 2021-10-30 (×4): qty 1

## 2021-10-30 MED ORDER — DIPHENHYDRAMINE HCL 25 MG PO CAPS: 25.0000 mg | ORAL_CAPSULE | Freq: Four times a day (QID) | ORAL | Status: DC | PRN

## 2021-10-30 MED ORDER — WITCH HAZEL-GLYCERIN EX PADS: 1.0000 "application " | MEDICATED_PAD | CUTANEOUS | Status: DC | PRN

## 2021-10-30 MED ORDER — ONDANSETRON HCL 4 MG/2ML IJ SOLN: 4.0000 mg | INTRAMUSCULAR | Status: DC | PRN

## 2021-10-30 MED ORDER — CEFAZOLIN SODIUM-DEXTROSE 2-4 GM/100ML-% IV SOLN
2.0000 g | Freq: Once | INTRAVENOUS | Status: AC
  Administered 2021-10-30: 08:00:00 2 g via INTRAVENOUS
  Filled 2021-10-30: qty 100

## 2021-10-30 MED ORDER — LACTATED RINGERS IV SOLN: 500.0000 mL | Freq: Once | INTRAVENOUS | Status: DC

## 2021-10-30 MED ORDER — IBUPROFEN 600 MG PO TABS
600.0000 mg | ORAL_TABLET | Freq: Four times a day (QID) | ORAL | Status: DC
  Administered 2021-10-30 – 2021-11-01 (×6): 600 mg via ORAL
  Filled 2021-10-30 (×7): qty 1

## 2021-10-30 MED ORDER — SIMETHICONE 80 MG PO CHEW: 80.0000 mg | CHEWABLE_TABLET | ORAL | Status: DC | PRN

## 2021-10-30 MED ORDER — ONDANSETRON HCL 4 MG/2ML IJ SOLN
4.0000 mg | Freq: Four times a day (QID) | INTRAMUSCULAR | Status: DC | PRN
  Administered 2021-10-30: 19:00:00 4 mg via INTRAVENOUS
  Filled 2021-10-30: qty 2

## 2021-10-30 MED ORDER — TETANUS-DIPHTH-ACELL PERTUSSIS 5-2.5-18.5 LF-MCG/0.5 IM SUSY: 0.5000 mL | PREFILLED_SYRINGE | Freq: Once | INTRAMUSCULAR | Status: DC

## 2021-10-30 MED ORDER — PHENYLEPHRINE 40 MCG/ML (10ML) SYRINGE FOR IV PUSH (FOR BLOOD PRESSURE SUPPORT): 80.0000 ug | PREFILLED_SYRINGE | INTRAVENOUS | Status: DC | PRN

## 2021-10-30 MED ORDER — LIDOCAINE HCL (PF) 1 % IJ SOLN: 30.0000 mL | INTRAMUSCULAR | Status: DC | PRN

## 2021-10-30 MED ORDER — EPHEDRINE 5 MG/ML INJ: 10.0000 mg | INTRAVENOUS | Status: DC | PRN

## 2021-10-30 MED ORDER — FENTANYL-BUPIVACAINE-NACL 0.5-0.125-0.9 MG/250ML-% EP SOLN
EPIDURAL | Status: AC
  Filled 2021-10-30: qty 250

## 2021-10-30 MED ORDER — OXYTOCIN BOLUS FROM INFUSION
333.0000 mL | Freq: Once | INTRAVENOUS | Status: AC
  Administered 2021-10-30: 20:00:00 333 mL via INTRAVENOUS

## 2021-10-30 MED ORDER — LACTATED RINGERS IV SOLN: INTRAVENOUS | Status: DC

## 2021-10-30 MED ORDER — OXYTOCIN-SODIUM CHLORIDE 30-0.9 UT/500ML-% IV SOLN
1.0000 m[IU]/min | INTRAVENOUS | Status: DC
  Administered 2021-10-30: 09:00:00 2 m[IU]/min via INTRAVENOUS
  Filled 2021-10-30: qty 500

## 2021-10-30 MED ORDER — LIDOCAINE HCL (PF) 1 % IJ SOLN
INTRAMUSCULAR | Status: DC | PRN
  Administered 2021-10-30 (×2): 4 mL via EPIDURAL

## 2021-10-30 MED ORDER — TERBUTALINE SULFATE 1 MG/ML IJ SOLN: 0.2500 mg | Freq: Once | INTRAMUSCULAR | Status: DC | PRN

## 2021-10-30 MED ORDER — CEFAZOLIN SODIUM-DEXTROSE 1-4 GM/50ML-% IV SOLN
1.0000 g | Freq: Three times a day (TID) | INTRAVENOUS | Status: DC
  Administered 2021-10-30: 17:00:00 1 g via INTRAVENOUS
  Filled 2021-10-30 (×3): qty 50

## 2021-10-30 MED ORDER — COCONUT OIL OIL
1.0000 "application " | TOPICAL_OIL | Status: DC | PRN
  Administered 2021-11-01: 1 via TOPICAL

## 2021-10-30 MED ORDER — DIBUCAINE (PERIANAL) 1 % EX OINT: 1.0000 "application " | TOPICAL_OINTMENT | CUTANEOUS | Status: DC | PRN

## 2021-10-30 MED ORDER — FENTANYL-BUPIVACAINE-NACL 0.5-0.125-0.9 MG/250ML-% EP SOLN
EPIDURAL | Status: DC | PRN
  Administered 2021-10-30: 12 mL/h via EPIDURAL

## 2021-10-30 MED ORDER — ACETAMINOPHEN 325 MG PO TABS: 650.0000 mg | ORAL_TABLET | ORAL | Status: DC | PRN

## 2021-10-30 MED ORDER — DIPHENHYDRAMINE HCL 50 MG/ML IJ SOLN: 12.5000 mg | INTRAMUSCULAR | Status: DC | PRN

## 2021-10-30 MED ORDER — METHYLERGONOVINE MALEATE 0.2 MG PO TABS: 0.2000 mg | ORAL_TABLET | ORAL | Status: DC | PRN

## 2021-10-30 MED ORDER — FENTANYL-BUPIVACAINE-NACL 0.5-0.125-0.9 MG/250ML-% EP SOLN: 12.0000 mL/h | EPIDURAL | Status: DC | PRN

## 2021-10-30 MED ORDER — PRENATAL MULTIVITAMIN CH
1.0000 | ORAL_TABLET | Freq: Every day | ORAL | Status: DC
  Administered 2021-10-31: 12:00:00 1 via ORAL
  Filled 2021-10-30: qty 1

## 2021-10-30 MED ORDER — OXYTOCIN-SODIUM CHLORIDE 30-0.9 UT/500ML-% IV SOLN
2.5000 [IU]/h | INTRAVENOUS | Status: DC
  Administered 2021-10-30: 21:00:00 2.5 [IU]/h via INTRAVENOUS
  Filled 2021-10-30: qty 500

## 2021-10-30 MED ORDER — SOD CITRATE-CITRIC ACID 500-334 MG/5ML PO SOLN: 30.0000 mL | ORAL | Status: DC | PRN

## 2021-10-30 MED ORDER — BENZOCAINE-MENTHOL 20-0.5 % EX AERO: 1.0000 "application " | INHALATION_SPRAY | CUTANEOUS | Status: DC | PRN

## 2021-10-30 MED ORDER — METHYLERGONOVINE MALEATE 0.2 MG/ML IJ SOLN: 0.2000 mg | INTRAMUSCULAR | Status: DC | PRN

## 2021-10-30 MED ORDER — SENNOSIDES-DOCUSATE SODIUM 8.6-50 MG PO TABS
2.0000 | ORAL_TABLET | Freq: Every day | ORAL | Status: DC
  Administered 2021-10-31: 09:00:00 2 via ORAL
  Filled 2021-10-30: qty 2

## 2021-10-30 MED ORDER — ONDANSETRON HCL 4 MG PO TABS: 4.0000 mg | ORAL_TABLET | ORAL | Status: DC | PRN

## 2021-10-30 NOTE — Anesthesia Procedure Notes (Signed)
Epidural Patient location during procedure: OB Start time: 10/30/2021 12:50 PM End time: 10/30/2021 12:53 PM  Staffing Anesthesiologist: Kaylyn Layer, MD Performed: anesthesiologist   Preanesthetic Checklist Completed: patient identified, IV checked, risks and benefits discussed, monitors and equipment checked, pre-op evaluation and timeout performed  Epidural Patient position: sitting Prep: DuraPrep and site prepped and draped Patient monitoring: continuous pulse ox, blood pressure and heart rate Approach: midline Location: L3-L4 Injection technique: LOR air  Needle:  Needle type: Tuohy  Needle gauge: 17 G Needle length: 9 cm Needle insertion depth: 5 cm Catheter type: closed end flexible Catheter size: 19 Gauge Catheter at skin depth: 10 cm Test dose: negative and Other (1% lidocaine)  Assessment Events: blood not aspirated, injection not painful, no injection resistance, no paresthesia and negative IV test  Additional Notes Patient identified. Risks, benefits, and alternatives discussed with patient including but not limited to bleeding, infection, nerve damage, paralysis, failed block, incomplete pain control, headache, blood pressure changes, nausea, vomiting, reactions to medication, itching, and postpartum back pain. Confirmed with bedside nurse the patient's most recent platelet count. Confirmed with patient that they are not currently taking any anticoagulation, have any bleeding history, or any family history of bleeding disorders. Patient expressed understanding and wished to proceed. All questions were answered. Sterile technique was used throughout the entire procedure. Please see nursing notes for vital signs.   Crisp LOR on first pass. Test dose was given through epidural catheter and negative prior to continuing to dose epidural or start infusion. Warning signs of high block given to the patient including shortness of breath, tingling/numbness in hands,  complete motor block, or any concerning symptoms with instructions to call for help. Patient was given instructions on fall risk and not to get out of bed. All questions and concerns addressed with instructions to call with any issues or inadequate analgesia.  Reason for block:procedure for pain

## 2021-10-30 NOTE — H&P (Addendum)
Tracy Alexander is a 35 y.o. female presenting for IOL for ICP and unexplained MSAFP elevation. OB History     Gravida  3   Para  2   Term  2   Preterm      AB      Living  2      SAB      IAB      Ectopic      Multiple  0   Live Births  2          Past Medical History:  Diagnosis Date   History of pyelonephritis    Vaginal Pap smear, abnormal    Past Surgical History:  Procedure Laterality Date   AUGMENTATION MAMMAPLASTY     TONSILLECTOMY     Family History: family history includes Leukemia in her father; Prostate cancer in her father; Skin cancer in her paternal grandmother. Social History:  reports that she has never smoked. She has never used smokeless tobacco. She reports that she does not currently use alcohol. She reports that she does not use drugs.     Maternal Diabetes: No Genetic Screening: Normal Maternal Ultrasounds/Referrals: Other: Fetal Ultrasounds or other Referrals:  Referred to Materal Fetal Medicine  Maternal Substance Abuse:  No Significant Maternal Medications:  None Significant Maternal Lab Results:  Group B Strep positive Other Comments:   Unexplained afp elevation. NL NIPS  Review of Systems  Constitutional: Negative.   History Dilation: 1.5 Effacement (%): 60 Station: -3 Exam by:: Marcelle Overlie RN Blood pressure 105/64, pulse 67, temperature (!) (P) 97.4 F (36.3 C), temperature source (P) Axillary, resp. rate (P) 17, last menstrual period 01/29/2021, unknown if currently breastfeeding. Maternal Exam:  Introitus: Normal vulva.  Physical Exam Constitutional:      Appearance: Normal appearance.  HENT:     Head: Normocephalic and atraumatic.  Cardiovascular:     Rate and Rhythm: Normal rate and regular rhythm.     Pulses: Normal pulses.     Heart sounds: Normal heart sounds.  Pulmonary:     Effort: Pulmonary effort is normal.     Breath sounds: Normal breath sounds.  Genitourinary:    General: Normal vulva.   Musculoskeletal:        General: Normal range of motion.     Cervical back: Normal range of motion and neck supple.  Skin:    General: Skin is warm and dry.     Coloration: Skin is not jaundiced.  Neurological:     General: No focal deficit present.     Mental Status: She is alert and oriented to person, place, and time.    Prenatal labs: ABO, Rh: --/--/O POS (11/27 9024) Antibody: NEG (11/27 0755) Rubella:  imm RPR:   neg HBsAg:   neg HIV:   neg GBS:   pos  Assessment/Plan: 39 week IUP ICP GBS pos Unexplained MSAFP IOl IV Cefazolin   Terease Marcotte J 10/30/2021, 9:32 AM

## 2021-10-30 NOTE — Anesthesia Preprocedure Evaluation (Signed)
Anesthesia Evaluation  Patient identified by MRN, date of birth, ID band Patient awake    Reviewed: Allergy & Precautions, Patient's Chart, lab work & pertinent test results  History of Anesthesia Complications Negative for: history of anesthetic complications  Airway Mallampati: II  TM Distance: >3 FB Neck ROM: Full    Dental no notable dental hx.    Pulmonary neg pulmonary ROS,    Pulmonary exam normal        Cardiovascular negative cardio ROS Normal cardiovascular exam     Neuro/Psych Anxiety Depression negative neurological ROS     GI/Hepatic negative GI ROS, Neg liver ROS,   Endo/Other  negative endocrine ROS  Renal/GU negative Renal ROS  negative genitourinary   Musculoskeletal negative musculoskeletal ROS (+)   Abdominal   Peds  Hematology negative hematology ROS (+)   Anesthesia Other Findings Day of surgery medications reviewed with patient.  Reproductive/Obstetrics (+) Pregnancy                             Anesthesia Physical Anesthesia Plan  ASA: 2  Anesthesia Plan: Epidural   Post-op Pain Management:    Induction:   PONV Risk Score and Plan: Treatment may vary due to age or medical condition  Airway Management Planned: Natural Airway  Additional Equipment: Fetal Monitoring  Intra-op Plan:   Post-operative Plan:   Informed Consent: I have reviewed the patients History and Physical, chart, labs and discussed the procedure including the risks, benefits and alternatives for the proposed anesthesia with the patient or authorized representative who has indicated his/her understanding and acceptance.       Plan Discussed with:   Anesthesia Plan Comments:         Anesthesia Quick Evaluation  

## 2021-10-30 NOTE — Progress Notes (Signed)
Tracy Alexander is a 35 y.o. G3P2002 at [redacted]w[redacted]d by LMP admitted for induction of labor due to cholestasis and inc afp.  Subjective: uncomfortable  Objective: BP (!) 98/53   Pulse 65   Temp (!) 97.5 F (36.4 C)   Resp 16   LMP 01/29/2021  No intake/output data recorded. No intake/output data recorded.  FHT:  FHR: 155 bpm, variability: moderate,  accelerations:  Present,  decelerations:  Absent UC:   regular, every 3 minutes SVE:   2/60/-2 AROM- clear  Labs: Lab Results  Component Value Date   WBC 7.7 10/30/2021   HGB 12.9 10/30/2021   HCT 37.3 10/30/2021   MCV 88.8 10/30/2021   PLT 146 (L) 10/30/2021    Assessment / Plan: Induction of labor due to ICP and inc afp,  progressing well on pitocin  Labor: Progressing normally Preeclampsia:  no signs or symptoms of toxicity Fetal Wellbeing:  Category I Pain Control:  Labor support without medications I/D:  n/a Anticipated MOD:  NSVD  Vittorio Mohs J 10/30/2021, 3:03 PM

## 2021-10-31 LAB — CBC
HCT: 33.2 % — ABNORMAL LOW (ref 36.0–46.0)
Hemoglobin: 11.6 g/dL — ABNORMAL LOW (ref 12.0–15.0)
MCH: 31.3 pg (ref 26.0–34.0)
MCHC: 34.9 g/dL (ref 30.0–36.0)
MCV: 89.5 fL (ref 80.0–100.0)
Platelets: 123 10*3/uL — ABNORMAL LOW (ref 150–400)
RBC: 3.71 MIL/uL — ABNORMAL LOW (ref 3.87–5.11)
RDW: 14 % (ref 11.5–15.5)
WBC: 9.4 10*3/uL (ref 4.0–10.5)
nRBC: 0 % (ref 0.0–0.2)

## 2021-10-31 MED ORDER — IBUPROFEN 600 MG PO TABS: 600.0000 mg | ORAL_TABLET | Freq: Four times a day (QID) | ORAL | 0 refills | Status: AC

## 2021-10-31 MED ORDER — COCONUT OIL OIL: 1.0000 "application " | TOPICAL_OIL | 0 refills | Status: AC | PRN

## 2021-10-31 MED ORDER — OXYCODONE-ACETAMINOPHEN 5-325 MG PO TABS: 1.0000 | ORAL_TABLET | ORAL | 0 refills | Status: AC | PRN

## 2021-10-31 NOTE — Discharge Summary (Addendum)
Edited for date of discharge 11/01/2021    Postpartum Discharge Summary  Date of Service updated 10/31/21    Patient Name: Tracy Alexander DOB: 1986/08/31 MRN: [id number]  Date of admission: 10/30/2021 Delivery date:10/30/2021  Delivering provider: Brien Few  Date of discharge: 11/01/2021  Admitting diagnosis: Encounter for induction of labor [Z34.90] Intrauterine pregnancy: [redacted]w[redacted]d    Secondary diagnosis:  Principal Problem:   Postpartum care following vaginal delivery 11/27 Active Problems:   Encounter for induction of labor   SVD (spontaneous vaginal delivery)  Additional problems: hx. Of ADD, Depression in 2008 and Anxiety in 2009, but not on meds, unexplained MSAFP elevation     Discharge diagnosis: Term Pregnancy Delivered                                              Post partum procedures: n/a Augmentation: AROM and Pitocin Complications: None  Hospital course: Induction of Labor With Vaginal Delivery   35y.o. yo G3P3003 at 332w1das admitted to the hospital 10/30/2021 for induction of labor.  Indication for induction:  39 week hx of unexplained MSAFP and hx of ICP .  Patient had an uncomplicated labor course as follows: Membrane Rupture Time/Date: 12:28 PM ,10/30/2021   Delivery Method:Vaginal, Spontaneous  Episiotomy: None  Lacerations:  None  Details of delivery can be found in separate delivery note.  Patient had a routine postpartum course. She did require low dose Percocet due to severe uterine cramps and requests prescription for discharge. Patient is discharged home 11/01/21.  Newborn Data: Birth date:10/30/2021  Birth time:7:44 PM  Gender:Female  Living status:Living  Apgars:9 ,9  Weight:3690 g  "James"  Magnesium Sulfate received: No BMZ received: No Rhophylac:N/A MMR:N/A T-DaP:Given prenatally Flu: Yes Covid: UTD Transfusion:No  Physical exam  Vitals:   10/31/21 1054 10/31/21 1430 10/31/21 2101 11/01/21 0531  BP: (!) 118/59 111/67 107/76  119/78  Pulse: (!) 59 63 66 (!) 57  Resp: _0 Temp: 97.9 F (36.6 C) (!) 97.5 F (36.4 C) 98.2 F (36.8 C) 97.9 F (36.6 C)  TempSrc: Oral Axillary Oral Oral  SpO2: 96%      General: alert, cooperative, and no distress Heart: RRR Lungs: clear, equal  Lochia: appropriate Uterine Fundus: firm, U-2 Perineum: intact DVT Evaluation: No evidence of DVT seen on physical exam. Negative Homan's sign. No cords or calf tenderness. Calf/Ankle edema is present Labs: Lab Results  Component Value Date   WBC 9.4 10/31/2021   HGB 11.6 (L) 10/31/2021   HCT 33.2 (L) 10/31/2021   MCV 89.5 10/31/2021   PLT 123 (L) 10/31/2021   CMP Latest Ref Rng & Units 01/08/2021  Glucose 70 - 99 mg/dL 111(H)  BUN 6 - 20 mg/dL 16  Creatinine 0.44 - 1.00 mg/dL 0.69  Sodium 135 - 145 mmol/L 138  Potassium 3.5 - 5.1 mmol/L 3.8  Chloride 98 - 111 mmol/L 100  CO2 22 - 32 mmol/L 25  Calcium 8.9 - 10.3 mg/dL 9.1  Total Protein 6.5 - 8.1 g/dL 7.0  Total Bilirubin 0.3 - 1.2 mg/dL 0.9  Alkaline Phos 38 - 126 U/L 47  AST 15 - 41 U/L 19  ALT 0 - 44 U/L 16   Edinburgh Score: Edinburgh Postnatal Depression Scale Screening Tool 10/31/2021  I have been able to laugh and see the funny side of things. 0  I  have looked forward with enjoyment to things. 0  I have blamed myself unnecessarily when things went wrong. 0  I have been anxious or worried for no good reason. 1  I have felt scared or panicky for no good reason. 1  Things have been getting on top of me. 0  I have been so unhappy that I have had difficulty sleeping. 0  I have felt sad or miserable. 0  I have been so unhappy that I have been crying. 1  The thought of harming myself has occurred to me. 0  Edinburgh Postnatal Depression Scale Total 3   After visit meds:  Allergies as of 11/01/2021       Reactions   Penicillins Hives   Did it involve swelling of the face/tongue/throat, SOB, or low BP? Yes Did it involve sudden or severe rash/hives,  skin peeling, or any reaction on the inside of your mouth or nose? No Did you need to seek medical attention at a hospital or doctor's office? No When did it last happen?  childhood     If all above answers are "NO", may proceed with cephalosporin use.   Cefaclor Nausea And Vomiting, Rash   Sulfa Antibiotics Hives        Medication List     STOP taking these medications    diphenhydrAMINE 25 MG tablet Commonly known as: BENADRYL   doxylamine (Sleep) 25 MG tablet Commonly known as: UNISOM   iron polysaccharides 150 MG capsule Commonly known as: NIFEREX   magnesium oxide 400 (241.3 Mg) MG tablet Commonly known as: MAG-OX       TAKE these medications    cetirizine 10 MG tablet Commonly known as: ZYRTEC Take 10 mg by mouth daily.   coconut oil Oil Apply 1 application topically as needed.   ibuprofen 600 MG tablet Commonly known as: ADVIL Take 1 tablet (600 mg total) by mouth every 6 (six) hours.   oxyCODONE-acetaminophen 5-325 MG tablet Commonly known as: PERCOCET/ROXICET Take 1 tablet by mouth every 4 (four) hours as needed (pain scale 4-7).   prenatal multivitamin Tabs tablet Take 1 tablet by mouth daily at 12 noon.       Discharge home in stable condition Infant Feeding: Breast Infant Disposition:home with mother pending peds assessment at 24 hrs; otherwise will plan to cancel mom's d/c Discharge instruction: per After Visit Summary and Postpartum booklet. Activity: Advance as tolerated. Pelvic rest for 6 weeks.  Diet: low salt diet Anticipated Birth Control: POPs Postpartum Appointment:6 weeks Additional Postpartum F/U: Postpartum Depression checkup Future Appointments:No future appointments. Follow up Visit:  Follow-up Information     Brien Few, MD Follow up in 6 week(s).   Specialty: Obstetrics and Gynecology Why: Postpartum visit Contact information: [address] [place] [address] [phone]                 11/01/2021 Suzan Nailer, CNM

## 2021-10-31 NOTE — Social Work (Signed)
MOB was referred for history of depression/anxiety/ADD.  * Referral screened out by Clinical Social Worker because none of the following criteria appear to apply: ~ History of anxiety/depression during this pregnancy, or of post-partum depression following prior delivery.  ~ Diagnosis of anxiety and/or depression within last 3 years. Per chart review, MOB was diagnosed with ADD, Depression in 2008 and Anxiety in 2009. OR  * MOB's symptoms currently being treated with medication and/or therapy. MOB has an active prescription for Adderall for ADD symptoms.   Please contact the Clinical Social Worker if needs arise, by [facility] request, or if MOB scores greater than 9/yes to question 10 on Edinburgh Postpartum Depression Screen.   Vivi Barrack, MSW, LCSW [facility]  Clinical Social Worker  [phone] 10/31/2021  10:02 AM

## 2021-10-31 NOTE — Anesthesia Postprocedure Evaluation (Signed)
Anesthesia Post Note  Patient: Tracy Alexander  Procedure(s) Performed: AN AD HOC LABOR EPIDURAL     Patient location during evaluation: Mother Baby Anesthesia Type: Epidural Level of consciousness: awake Pain management: satisfactory to patient Vital Signs Assessment: post-procedure vital signs reviewed and stable Respiratory status: spontaneous breathing Cardiovascular status: stable Anesthetic complications: no   No notable events documented.  Last Vitals:  Vitals:   10/31/21 0636 10/31/21 0723  BP: 111/62 115/74  Pulse: 64 64  Resp: 14 16  Temp: 36.6 C 36.6 C  SpO2: 99% 97%    Last Pain:  Vitals:   10/31/21 0745  TempSrc:   PainSc: 8    Pain Goal:                   Cephus Shelling

## 2021-11-12 ENCOUNTER — Telehealth (HOSPITAL_COMMUNITY): Payer: Self-pay

## 2021-11-12 NOTE — Telephone Encounter (Signed)
No answer. Left message to return nurse call.  Marcelino Duster [facility] 11/12/2021,[id number]

## 2023-01-03 IMAGING — US US MFM OB FOLLOW-UP
1 series · 13 of 28 positions shown · non-contrast
Comparison: none

[Series 1: us mfm ob follow-up · 13 of 74 slices shown]
[im 3/74]
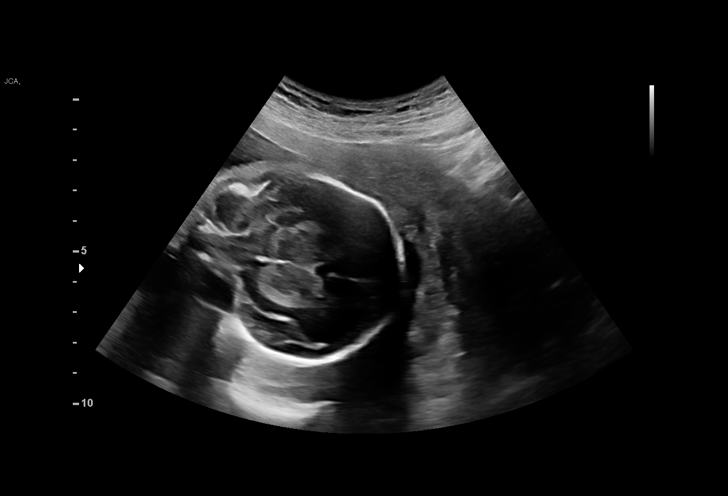
[im 9/74]
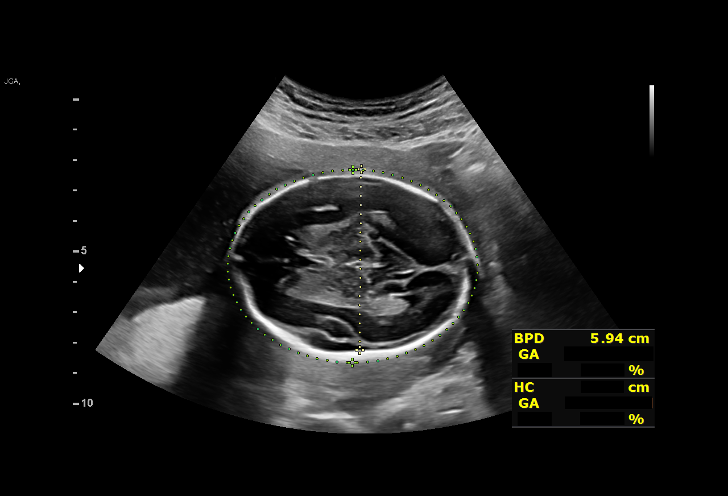
[im 14/74]
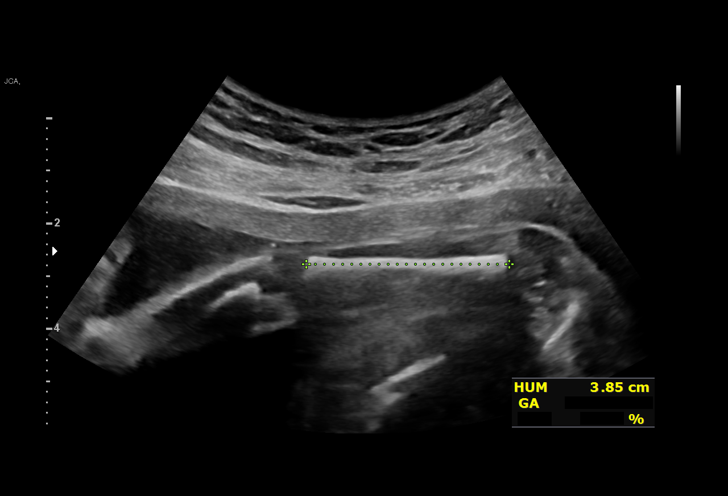
[im 19/74]
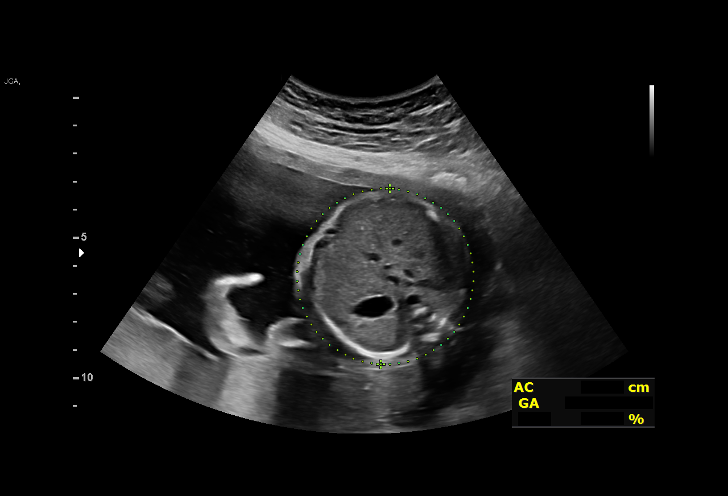
[im 25/74]
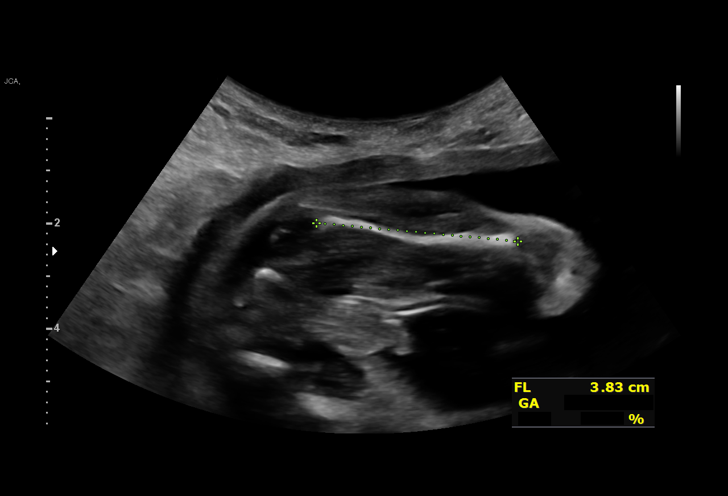
[im 30/74]
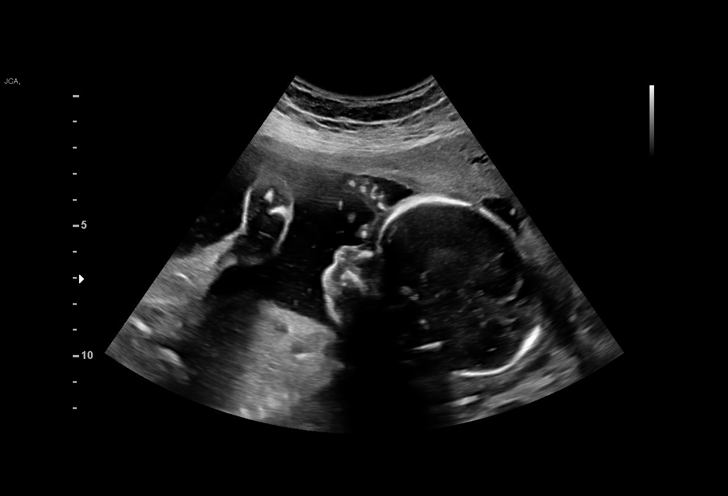
[im 38/74]
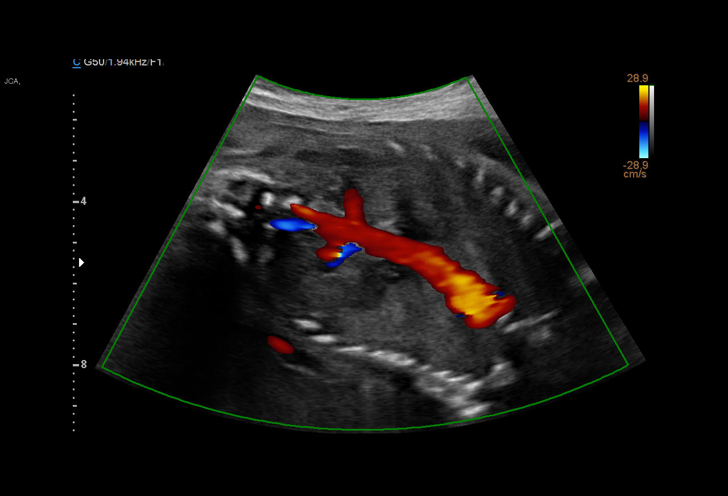
[im 44/74]
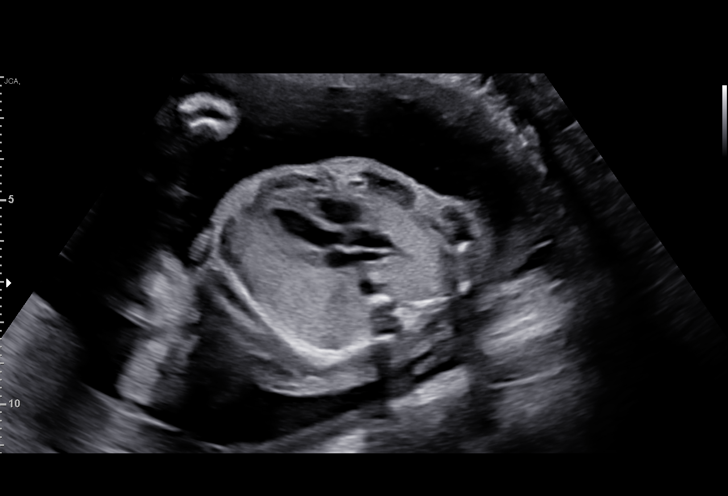
[im 49/74]
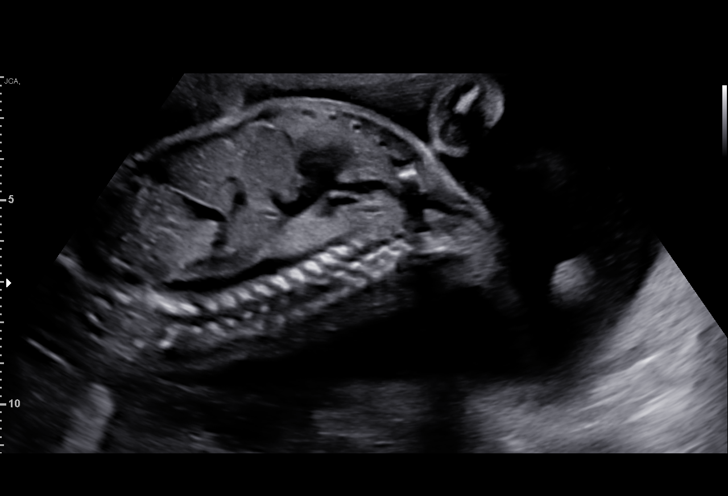
[im 55/74]
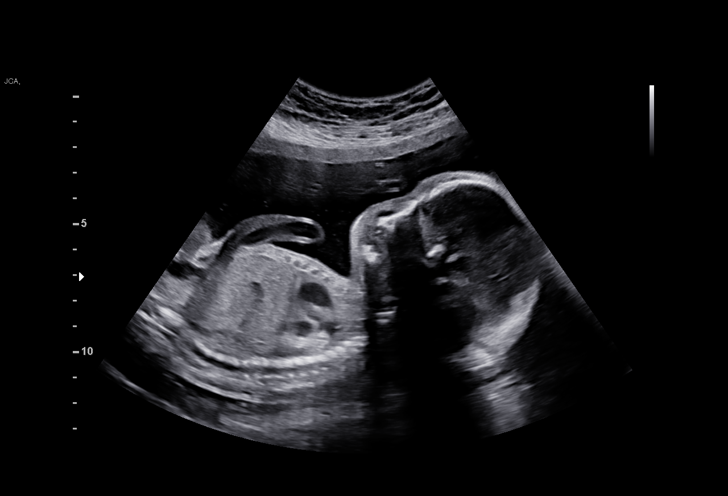
[im 60/74]
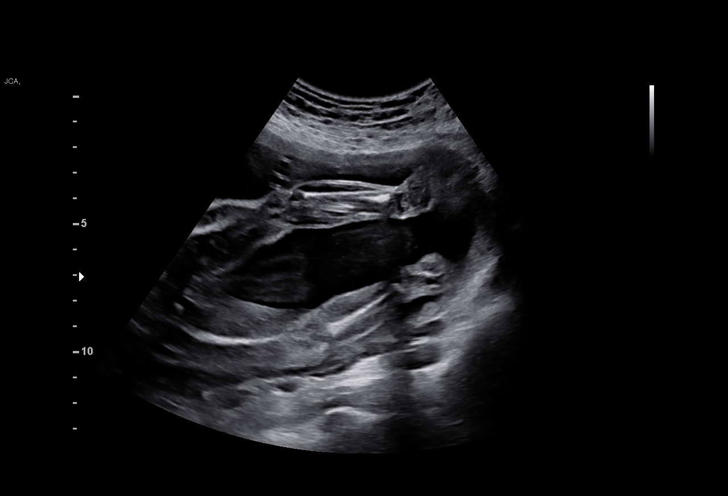
[im 65/74]
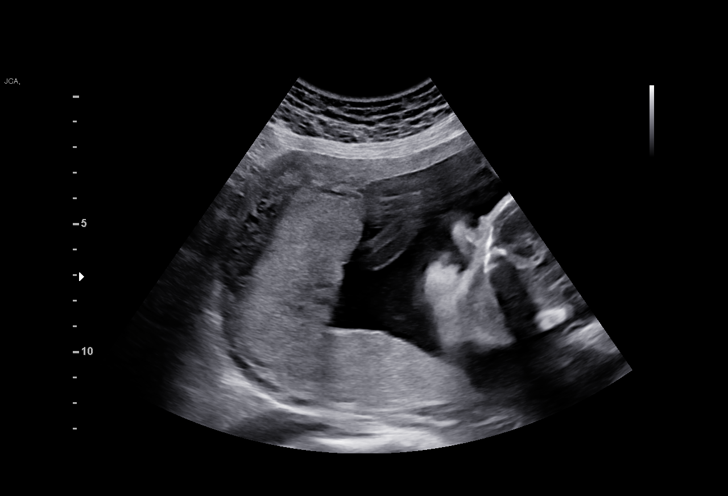
[im 71/74]
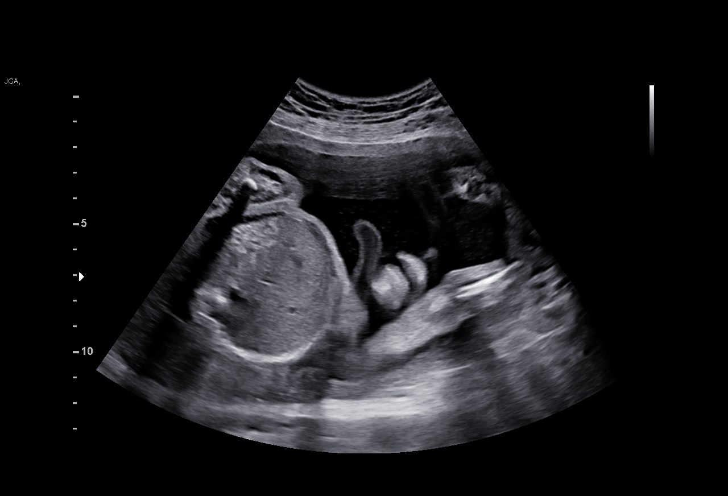

[13 of 28 positions shown; findings below may reference images not displayed]

& Infertility
                                                            3336 [REDACTED]

Indications

 Advanced maternal age multigravida 35+,
 second trimester
 Elevated MSAFP
 Encounter for raised alfafetoprotein level;
 increase HcG
 Obesity complicating pregnancy, second
 trimester
 Encounter for other antenatal screening
 follow-up
 23 weeks gestation of pregnancy
Fetal Evaluation

 Num Of Fetuses:         1
 Fetal Heart Rate(bpm):  153
 Cardiac Activity:       Observed
 Presentation:           Cephalic
 Placenta:               Posterior
 P. Cord Insertion:      Visualized

 Amniotic Fluid
 AFI FV:      Within normal limits

                             Largest Pocket(cm)

Biometry

 BPD:      58.7  mm     G. Age:  24w 0d         72  %    CI:        67.89   %    70 - 86
                                                         FL/HC:      16.7   %    19.2 -
 HC:      227.9  mm     G. Age:  24w 6d         88  %    HC/AC:      1.15        1.05 -
 AC:      197.4  mm     G. Age:  24w 3d         76  %    FL/BPD:     64.7   %    71 - 87
 FL:         38  mm     G. Age:  22w 1d         10  %    FL/AC:      19.3   %    20 - 24
 HUM:      38.6  mm     G. Age:  23w 5d         50  %
 LV:        4.9  mm

 Est. FW:     609  gm      1 lb 5 oz     57  %
OB History

 Gravidity:    3         Term:   2
 Living:       2
Gestational Age

 LMP:           23w 2d        Date:  01/29/21                 EDD:   11/05/21
 U/S Today:     23w 6d                                        EDD:   11/01/21
 Best:          23w 2d     Det. By:  LMP  (01/29/21)          EDD:   11/05/21
Anatomy

 Cranium:               Appears normal         LVOT:                   Previously seen
 Cavum:                 Previously seen        Aortic Arch:            Previously seen
 Ventricles:            Appears normal         Ductal Arch:            Previously seen
 Choroid Plexus:        Previously seen        Diaphragm:              Appears normal
 Cerebellum:            Previously seen        Stomach:                Appears normal, left
                                                                       sided
 Posterior Fossa:       Previously seen        Abdomen:                Previously seen
 Nuchal Fold:           Not applicable (>20    Abdominal Wall:         Previously seen
                        wks GA)
 Face:                  Orbits and profile     Cord Vessels:           Previously seen
                        previously seen
 Lips:                  Previously seen        Kidneys:                Appear normal
 Palate:                Not well visualized    Bladder:                Appears normal
 Thoracic:              Appears normal         Spine:                  Previously seen
 Heart:                 Previously seen        Upper Extremities:      Previously seen
 RVOT:                  Previously seen        Lower Extremities:      Previously seen

 Other:  Fetus appears to be a male. Nasal bone and lenses prev seen .
         Heels/feet and open hands/5th digits prev visualized. Bicaval prev
         seen
Cervix Uterus Adnexa

 Cervix
 Length:           4.96  cm.
 Normal appearance by transabdominal scan.
Comments

 This patient was seen for a follow up growth scan due to an
 unexplained elevated MSAFP of 2.75 MoM.  She denies any
 problems since her last exam.
 She was informed that the fetal growth and amniotic fluid
 level appears appropriate for her gestational age.
 Due to the unexplained elevated MSAFP, she should
 continue to be followed with growth ultrasounds every 4 to 5
 weeks.  These ultrasounds may be performed in your office
 or ours.
 No further exams were scheduled in our office.  We would be
 happy to see her again in the future if necessary.
Recommendations

 Growth ultrasounds every 4 to 5 weeks

## 2024-09-10 ENCOUNTER — Encounter: Payer: Self-pay | Admitting: Podiatry

## 2024-09-10 ENCOUNTER — Ambulatory Visit (INDEPENDENT_AMBULATORY_CARE_PROVIDER_SITE_OTHER): Admitting: Podiatry

## 2024-09-10 ENCOUNTER — Ambulatory Visit (INDEPENDENT_AMBULATORY_CARE_PROVIDER_SITE_OTHER)

## 2024-09-10 DIAGNOSIS — M722 Plantar fascial fibromatosis: Secondary | ICD-10-CM

## 2024-09-10 MED ORDER — DICLOFENAC SODIUM 75 MG PO TBEC: 75.0000 mg | DELAYED_RELEASE_TABLET | Freq: Two times a day (BID) | ORAL | 2 refills | Status: AC

## 2024-09-10 MED ORDER — TRIAMCINOLONE ACETONIDE 10 MG/ML IJ SUSP
10.0000 mg | Freq: Once | INTRAMUSCULAR | Status: AC
  Administered 2024-09-10: 10 mg via INTRA_ARTICULAR

## 2024-09-10 NOTE — Patient Instructions (Signed)

## 2024-09-10 NOTE — Progress Notes (Signed)
 Subjective:   Patient ID: Tracy Alexander, female   DOB: 38 y.o.   MRN: [id number]   HPI Patient presents with chronic pain of the heel region bilateral with a worsening of the condition over the last several months.  States that she also fractured her leg when she was in seventh grade and may have limb length discrepancy.  Patient states the pain is mostly in the bottom but also somewhat in the back.  Patient does not smoke likes to be active   Review of Systems  All other systems reviewed and are negative.       Objective:  Physical Exam Vitals and nursing note reviewed.  Constitutional:      Appearance: She is well-developed.  Pulmonary:     Effort: Pulmonary effort is normal.  Musculoskeletal:        General: Normal range of motion.  Skin:    General: Skin is warm.  Neurological:     Mental Status: She is alert.     Neurovascular status intact muscle strength found to be adequate range of motion adequate with exquisite discomfort plantar medial aspect of the fascia bilateral that is more acute and also chronic nature to condition with moderate flatfoot deformity.  Right leg at least 1/4 inch shorter than left with patient found to have good digital perfusion well-oriented x 3     Assessment:  Acute plantar fasciitis bilateral inflammation fluid of the medial band     Plan:  H&P reviewed I do think orthotics long-term with heel lift right would be of benefit and today I went ahead and I did do sterile prep and injected the plantar fascia bilateral 3 mg Kenalog 5 mg Xylocaine  to reduce the acute inflammation and applied instructions for stretching exercises.  Also placed on diclofenac 75 mg twice daily discussed orthotics reappoint to recheck  X-rays indicate minimal spur to indicate flatfoot deformity bilaterally

## 2024-09-24 ENCOUNTER — Ambulatory Visit: Admitting: Podiatry

## 2024-09-24 ENCOUNTER — Encounter: Payer: Self-pay | Admitting: Podiatry

## 2024-09-24 DIAGNOSIS — M722 Plantar fascial fibromatosis: Secondary | ICD-10-CM

## 2024-09-24 NOTE — Progress Notes (Signed)
 Subjective:   Patient ID: Tracy Alexander, female   DOB: 38 y.o.   MRN: [id number]   HPI Patient presents stating my heels are feeling much better I have had long-term history of this and my right 1 is slightly shorter due to history of fracture   ROS      Objective:  Physical Exam  Neurovascular status intact with inflammation of the plantar fascia that is improving still mildly tender but much better than it was with flatfoot deformity noted F2 acute fascial inflammation bilateral with limb length discrepancy     Assessment:  Above list the acute inflammation     Plan:  Reviewed at great length and at this point I have recommended orthotic devices patient is casted for functional devices to offload weight from the arch he will and to address flatfoot structure.  Also add heel lift to the right and continue with stretching exercises good shoe gear choices and oral anti-inflammatories and discussed oral anti-inflammatories and the pattern that I like her to take

## 2024-11-10 ENCOUNTER — Other Ambulatory Visit (HOSPITAL_COMMUNITY): Payer: Self-pay

## 2024-11-10 ENCOUNTER — Other Ambulatory Visit: Payer: Self-pay

## 2024-11-10 MED ORDER — AMPHETAMINE-DEXTROAMPHET ER 10 MG PO CP24
10.0000 mg | ORAL_CAPSULE | Freq: Every day | ORAL | 0 refills | Status: DC
  Filled 2024-11-10: qty 30, 30d supply, fill #0

## 2024-11-10 MED ORDER — AMPHETAMINE-DEXTROAMPHETAMINE 5 MG PO TABS
10.0000 mg | ORAL_TABLET | Freq: Every day | ORAL | 0 refills | Status: DC
  Filled 2024-11-10: qty 60, 30d supply, fill #0

## 2024-11-19 ENCOUNTER — Other Ambulatory Visit (HOSPITAL_COMMUNITY): Payer: Self-pay

## 2024-11-19 ENCOUNTER — Other Ambulatory Visit

## 2024-11-19 ENCOUNTER — Other Ambulatory Visit: Payer: Self-pay

## 2024-11-19 MED ORDER — OSELTAMIVIR PHOSPHATE 75 MG PO CAPS
75.0000 mg | ORAL_CAPSULE | Freq: Every day | ORAL | 0 refills | Status: AC
  Filled 2024-11-19: qty 2, 2d supply, fill #0
  Filled 2024-11-19: qty 10, 10d supply, fill #0
  Filled 2024-11-19: qty 8, 8d supply, fill #0

## 2024-11-25 ENCOUNTER — Ambulatory Visit (INDEPENDENT_AMBULATORY_CARE_PROVIDER_SITE_OTHER): Admitting: Podiatrist

## 2024-11-25 DIAGNOSIS — M722 Plantar fascial fibromatosis: Secondary | ICD-10-CM

## 2024-11-25 NOTE — Progress Notes (Signed)

## 2024-12-23 ENCOUNTER — Other Ambulatory Visit (HOSPITAL_COMMUNITY): Payer: Self-pay

## 2024-12-23 MED ORDER — ONDANSETRON 8 MG PO TBDP
8.0000 mg | ORAL_TABLET | Freq: Three times a day (TID) | ORAL | 11 refills | Status: AC | PRN
  Filled 2024-12-23: qty 18, 21d supply, fill #0

## 2024-12-24 ENCOUNTER — Other Ambulatory Visit (HOSPITAL_COMMUNITY): Payer: Self-pay

## 2024-12-24 MED ORDER — AMPHETAMINE-DEXTROAMPHET ER 10 MG PO CP24
10.0000 mg | ORAL_CAPSULE | Freq: Every day | ORAL | 0 refills | Status: AC
  Filled 2024-12-24: qty 30, 30d supply, fill #0

## 2024-12-24 MED ORDER — AMPHETAMINE-DEXTROAMPHETAMINE 5 MG PO TABS
10.0000 mg | ORAL_TABLET | Freq: Every day | ORAL | 0 refills | Status: AC
  Filled 2024-12-24: qty 60, 30d supply, fill #0
